# Patient Record
Sex: Female | Born: 1955 | Race: White | Hispanic: No | Marital: Married | Smoking: Former smoker
Health system: Southern US, Community
[De-identification: ages and names within clinical notes are randomized; demographics above are authoritative.]

## PROBLEM LIST (undated history)

## (undated) DIAGNOSIS — Z79899 Other long term (current) drug therapy: Secondary | ICD-10-CM

## (undated) DIAGNOSIS — I10 Essential (primary) hypertension: Secondary | ICD-10-CM

## (undated) DIAGNOSIS — M7660 Achilles tendinitis, unspecified leg: Secondary | ICD-10-CM

## (undated) DIAGNOSIS — B009 Herpesviral infection, unspecified: Secondary | ICD-10-CM

## (undated) DIAGNOSIS — N809 Endometriosis, unspecified: Secondary | ICD-10-CM

## (undated) HISTORY — DX: Achilles tendinitis, unspecified leg: M76.60

## (undated) HISTORY — DX: Herpesviral infection, unspecified: B00.9

## (undated) HISTORY — PX: BILATERAL SALPINGOOPHORECTOMY: SHX1223

## (undated) HISTORY — DX: Essential (primary) hypertension: I10

## (undated) HISTORY — DX: Endometriosis, unspecified: N80.9

## (undated) HISTORY — PX: ABDOMINAL HYSTERECTOMY: SHX81

## (undated) HISTORY — DX: Other long term (current) drug therapy: Z79.899

---

## 2006-03-22 ENCOUNTER — Encounter: Admission: RE | Admit: 2006-03-22 | Discharge: 2006-03-22 | Payer: Self-pay | Admitting: General Surgery

## 2007-05-10 ENCOUNTER — Encounter: Admission: RE | Admit: 2007-05-10 | Discharge: 2007-05-10 | Payer: Self-pay | Admitting: General Surgery

## 2008-03-14 ENCOUNTER — Other Ambulatory Visit: Admission: RE | Admit: 2008-03-14 | Discharge: 2008-03-14 | Payer: Self-pay | Admitting: Obstetrics and Gynecology

## 2008-06-03 ENCOUNTER — Encounter: Admission: RE | Admit: 2008-06-03 | Discharge: 2008-06-03 | Payer: Self-pay | Admitting: Obstetrics and Gynecology

## 2009-09-02 ENCOUNTER — Encounter: Admission: RE | Admit: 2009-09-02 | Discharge: 2009-09-02 | Payer: Self-pay | Admitting: Obstetrics and Gynecology

## 2010-09-17 ENCOUNTER — Other Ambulatory Visit: Payer: Self-pay | Admitting: Obstetrics and Gynecology

## 2010-09-17 DIAGNOSIS — Z1231 Encounter for screening mammogram for malignant neoplasm of breast: Secondary | ICD-10-CM

## 2010-10-06 ENCOUNTER — Ambulatory Visit
Admission: RE | Admit: 2010-10-06 | Discharge: 2010-10-06 | Disposition: A | Discharge status: Home / Self Care | Payer: BC Managed Care – PPO | Source: Ambulatory Visit | Attending: Obstetrics and Gynecology | Admitting: Obstetrics and Gynecology

## 2010-10-06 DIAGNOSIS — Z1231 Encounter for screening mammogram for malignant neoplasm of breast: Secondary | ICD-10-CM

## 2011-08-11 ENCOUNTER — Other Ambulatory Visit: Payer: Self-pay | Admitting: Obstetrics and Gynecology

## 2011-08-11 DIAGNOSIS — Z1231 Encounter for screening mammogram for malignant neoplasm of breast: Secondary | ICD-10-CM

## 2011-10-07 ENCOUNTER — Ambulatory Visit
Admission: RE | Admit: 2011-10-07 | Discharge: 2011-10-07 | Disposition: A | Discharge status: Home / Self Care | Payer: BC Managed Care – PPO | Source: Ambulatory Visit | Attending: Obstetrics and Gynecology | Admitting: Obstetrics and Gynecology

## 2011-10-07 DIAGNOSIS — Z1231 Encounter for screening mammogram for malignant neoplasm of breast: Secondary | ICD-10-CM

## 2012-11-06 ENCOUNTER — Other Ambulatory Visit: Payer: Self-pay

## 2012-11-06 DIAGNOSIS — Z1231 Encounter for screening mammogram for malignant neoplasm of breast: Secondary | ICD-10-CM

## 2012-11-22 ENCOUNTER — Ambulatory Visit
Admission: RE | Admit: 2012-11-22 | Discharge: 2012-11-22 | Disposition: A | Discharge status: Home / Self Care | Payer: BC Managed Care – PPO | Source: Ambulatory Visit

## 2012-11-22 DIAGNOSIS — Z1231 Encounter for screening mammogram for malignant neoplasm of breast: Secondary | ICD-10-CM

## 2013-07-11 HISTORY — PX: ACHILLES TENDON REPAIR: SUR1153

## 2013-07-31 ENCOUNTER — Other Ambulatory Visit: Payer: Self-pay | Admitting: Adult Health

## 2013-10-22 ENCOUNTER — Encounter: Payer: Self-pay | Admitting: Adult Health

## 2013-10-22 ENCOUNTER — Ambulatory Visit (INDEPENDENT_AMBULATORY_CARE_PROVIDER_SITE_OTHER): Payer: BC Managed Care – PPO | Admitting: Adult Health

## 2013-10-22 VITALS — BP 142/86 | HR 76 | Ht 64.0 in | Wt 159.0 lb

## 2013-10-22 DIAGNOSIS — Z01419 Encounter for gynecological examination (general) (routine) without abnormal findings: Secondary | ICD-10-CM

## 2013-10-22 DIAGNOSIS — Z79899 Other long term (current) drug therapy: Secondary | ICD-10-CM

## 2013-10-22 DIAGNOSIS — Z79818 Long term (current) use of other agents affecting estrogen receptors and estrogen levels: Secondary | ICD-10-CM

## 2013-10-22 DIAGNOSIS — Z1212 Encounter for screening for malignant neoplasm of rectum: Secondary | ICD-10-CM

## 2013-10-22 HISTORY — DX: Long term (current) use of other agents affecting estrogen receptors and estrogen levels: Z79.818

## 2013-10-22 HISTORY — DX: Other long term (current) drug therapy: Z79.899

## 2013-10-22 LAB — HEMOCCULT GUIAC POC 1CARD (OFFICE): FECAL OCCULT BLD: NEGATIVE

## 2013-10-22 MED ORDER — ESTRADIOL 1 MG PO TABS: ORAL_TABLET | ORAL | Status: DC

## 2013-10-22 NOTE — Patient Instructions (Signed)
Physical in 1 year Mammogram yearly  Colonoscopy advised Labs at work

## 2013-10-22 NOTE — Progress Notes (Signed)
Patient ID: Julia Harmon, female   DOB: Dec 06, 1955, 58 y.o.   MRN: [id number] History of Present Illness: [name] is a 58 year old white female, in for a physical.  Current Medications, Allergies, Past Medical History, Past Surgical History, Family History and Social History were reviewed in [facility] electronic medical record.     Review of Systems: Patient denies any headaches, blurred vision, shortness of breath, chest pain, abdominal pain, problems with bowel movements, urination, or intercourse. No mood swings, she has achilles tendonitis in left foot.    Physical Exam:BP 142/86  Pulse 76  Ht 5\' 4"  (1.626 m)  Wt 159 lb (72.122 kg)  BMI 27.28 kg/m2 General:  Well developed, well nourished, no acute distress Skin:  Warm and dry Neck:  Midline trachea, normal thyroid Lungs; Clear to auscultation bilaterally Breast:  No dominant palpable mass, retraction, or nipple discharge Cardiovascular: Regular rate and rhythm Abdomen:  Soft, non tender, no hepatosplenomegaly Pelvic:  External genitalia is normal in appearance.  The vagina is normal in appearance. The cervix and uterus are absent.  No adnexal masses or tenderness noted. Rectal: Good sphincter tone, no polyps, or hemorrhoids felt.  Hemoccult negative. Extremities:  No swelling or varicosities noted Psych:  No mood changes, alert and cooperative, seems happy Discussed, will stay on estrogen til 63 then try to wean off.  Impression: Yearly gyn exam, no pap Estrogen therapy   Plan: Physical in 1 year Mammogram yearly Colonoscopy due Refilled estrace 1 mg #30 1 daily with 11 refills Labs at work

## 2013-10-28 ENCOUNTER — Other Ambulatory Visit: Payer: Self-pay | Admitting: Adult Health

## 2014-04-09 ENCOUNTER — Other Ambulatory Visit: Payer: Self-pay

## 2014-04-09 DIAGNOSIS — Z1231 Encounter for screening mammogram for malignant neoplasm of breast: Secondary | ICD-10-CM

## 2014-04-24 ENCOUNTER — Ambulatory Visit: Payer: BC Managed Care – PPO

## 2014-04-30 ENCOUNTER — Ambulatory Visit
Admission: RE | Admit: 2014-04-30 | Discharge: 2014-04-30 | Disposition: A | Discharge status: Home / Self Care | Payer: BC Managed Care – PPO | Source: Ambulatory Visit

## 2014-04-30 DIAGNOSIS — Z1231 Encounter for screening mammogram for malignant neoplasm of breast: Secondary | ICD-10-CM

## 2014-10-29 ENCOUNTER — Other Ambulatory Visit: Payer: Self-pay | Admitting: Adult Health

## 2014-11-03 ENCOUNTER — Telehealth: Payer: Self-pay | Admitting: Adult Health

## 2014-11-03 NOTE — Telephone Encounter (Signed)
Spoke with pt. Pt is requesting a refill on Estradiol 1 mg, takes 1 daily. Pt will make an appt in the upcoming month. Thanks!! JSY

## 2014-12-03 ENCOUNTER — Other Ambulatory Visit: Payer: Self-pay | Admitting: Adult Health

## 2015-05-18 ENCOUNTER — Other Ambulatory Visit: Payer: Self-pay

## 2015-05-18 DIAGNOSIS — Z1231 Encounter for screening mammogram for malignant neoplasm of breast: Secondary | ICD-10-CM

## 2015-06-08 ENCOUNTER — Ambulatory Visit
Admission: RE | Admit: 2015-06-08 | Discharge: 2015-06-08 | Disposition: A | Discharge status: Home / Self Care | Payer: BC Managed Care – PPO | Source: Ambulatory Visit

## 2015-06-08 DIAGNOSIS — Z1231 Encounter for screening mammogram for malignant neoplasm of breast: Secondary | ICD-10-CM

## 2015-12-07 ENCOUNTER — Other Ambulatory Visit: Payer: Self-pay | Admitting: Adult Health

## 2016-01-08 ENCOUNTER — Other Ambulatory Visit: Payer: Self-pay | Admitting: Adult Health

## 2016-04-11 ENCOUNTER — Other Ambulatory Visit: Payer: Self-pay | Admitting: Adult Health

## 2016-05-16 ENCOUNTER — Other Ambulatory Visit: Payer: Self-pay | Admitting: Adult Health

## 2016-06-16 ENCOUNTER — Other Ambulatory Visit: Payer: Self-pay | Admitting: Adult Health

## 2016-07-21 ENCOUNTER — Other Ambulatory Visit: Payer: Self-pay | Admitting: Adult Health

## 2016-08-08 ENCOUNTER — Other Ambulatory Visit: Payer: Self-pay | Admitting: Obstetrics and Gynecology

## 2016-08-08 DIAGNOSIS — Z1231 Encounter for screening mammogram for malignant neoplasm of breast: Secondary | ICD-10-CM

## 2016-08-22 ENCOUNTER — Ambulatory Visit
Admission: RE | Admit: 2016-08-22 | Discharge: 2016-08-22 | Disposition: A | Discharge status: Home / Self Care | Payer: BC Managed Care – PPO | Source: Ambulatory Visit | Attending: Obstetrics and Gynecology | Admitting: Obstetrics and Gynecology

## 2016-08-22 DIAGNOSIS — Z1231 Encounter for screening mammogram for malignant neoplasm of breast: Secondary | ICD-10-CM

## 2016-08-23 ENCOUNTER — Other Ambulatory Visit: Payer: Self-pay | Admitting: Adult Health

## 2016-09-06 ENCOUNTER — Encounter: Payer: Self-pay | Admitting: Adult Health

## 2016-09-06 ENCOUNTER — Ambulatory Visit (INDEPENDENT_AMBULATORY_CARE_PROVIDER_SITE_OTHER): Payer: BC Managed Care – PPO | Admitting: Adult Health

## 2016-09-06 VITALS — BP 160/90 | HR 88 | Ht 63.0 in | Wt 162.0 lb

## 2016-09-06 DIAGNOSIS — Z01419 Encounter for gynecological examination (general) (routine) without abnormal findings: Secondary | ICD-10-CM | POA: Insufficient documentation

## 2016-09-06 DIAGNOSIS — Z79899 Other long term (current) drug therapy: Secondary | ICD-10-CM

## 2016-09-06 DIAGNOSIS — Z1211 Encounter for screening for malignant neoplasm of colon: Secondary | ICD-10-CM | POA: Diagnosis not present

## 2016-09-06 DIAGNOSIS — I10 Essential (primary) hypertension: Secondary | ICD-10-CM | POA: Insufficient documentation

## 2016-09-06 DIAGNOSIS — Z01411 Encounter for gynecological examination (general) (routine) with abnormal findings: Secondary | ICD-10-CM

## 2016-09-06 DIAGNOSIS — Z131 Encounter for screening for diabetes mellitus: Secondary | ICD-10-CM

## 2016-09-06 DIAGNOSIS — Z1212 Encounter for screening for malignant neoplasm of rectum: Secondary | ICD-10-CM

## 2016-09-06 LAB — HEMOCCULT GUIAC POC 1CARD (OFFICE): Fecal Occult Blood, POC: NEGATIVE

## 2016-09-06 MED ORDER — LOSARTAN POTASSIUM-HCTZ 50-12.5 MG PO TABS: 1.0000 | ORAL_TABLET | Freq: Every day | ORAL | 6 refills | Status: DC

## 2016-09-06 MED ORDER — ESTRADIOL 1 MG PO TABS: 1.0000 mg | ORAL_TABLET | Freq: Every day | ORAL | 12 refills | Status: DC

## 2016-09-06 NOTE — Patient Instructions (Signed)
DASH Eating Plan DASH stands for "Dietary Approaches to Stop Hypertension." The DASH eating plan is a healthy eating plan that has been shown to reduce high blood pressure (hypertension). It may also reduce your risk for type 2 diabetes, heart disease, and stroke. The DASH eating plan may also help with weight loss. What are tips for following this plan? General guidelines  Avoid eating more than 2,300 mg (milligrams) of salt (sodium) a day. If you have hypertension, you may need to reduce your sodium intake to 1,500 mg a day.  Limit alcohol intake to no more than 1 drink a day for nonpregnant women and 2 drinks a day for men. One drink equals 12 oz of beer, 5 oz of wine, or 1 oz of hard liquor.  Work with your health care provider to maintain a healthy body weight or to lose weight. Ask what an ideal weight is for you.  Get at least 30 minutes of exercise that causes your heart to beat faster (aerobic exercise) most days of the week. Activities may include walking, swimming, or biking.  Work with your health care provider or diet and nutrition specialist (dietitian) to adjust your eating plan to your individual calorie needs. Reading food labels  Check food labels for the amount of sodium per serving. Choose foods with less than 5 percent of the Daily Value of sodium. Generally, foods with less than 300 mg of sodium per serving fit into this eating plan.  To find whole grains, look for the word "whole" as the first word in the ingredient list. Shopping  Buy products labeled as "low-sodium" or "no salt added."  Buy fresh foods. Avoid canned foods and premade or frozen meals. Cooking  Avoid adding salt when cooking. Use salt-free seasonings or herbs instead of table salt or sea salt. Check with your health care provider or pharmacist before using salt substitutes.  Do not fry foods. Cook foods using healthy methods such as baking, boiling, grilling, and broiling instead.  Cook with  heart-healthy oils, such as olive, canola, soybean, or sunflower oil. Meal planning   Eat a balanced diet that includes: ? 5 or more servings of fruits and vegetables each day. At each meal, try to fill half of your plate with fruits and vegetables. ? Up to 6-8 servings of whole grains each day. ? Less than 6 oz of lean meat, poultry, or fish each day. A 3-oz serving of meat is about the same size as a deck of cards. One egg equals 1 oz. ? 2 servings of low-fat dairy each day. ? A serving of nuts, seeds, or beans 5 times each week. ? Heart-healthy fats. Healthy fats called Omega-3 fatty acids are found in foods such as flaxseeds and coldwater fish, like sardines, salmon, and mackerel.  Limit how much you eat of the following: ? Canned or prepackaged foods. ? Food that is high in trans fat, such as fried foods. ? Food that is high in saturated fat, such as fatty meat. ? Sweets, desserts, sugary drinks, and other foods with added sugar. ? Full-fat dairy products.  Do not salt foods before eating.  Try to eat at least 2 vegetarian meals each week.  Eat more home-cooked food and less restaurant, buffet, and fast food.  When eating at a restaurant, ask that your food be prepared with less salt or no salt, if possible. What foods are recommended? The items listed may not be a complete list. Talk with your dietitian about what   dietary choices are best for you. Grains Whole-grain or whole-wheat bread. Whole-grain or whole-wheat pasta. Brown rice. Oatmeal. Quinoa. Bulgur. Whole-grain and low-sodium cereals. Pita bread. Low-fat, low-sodium crackers. Whole-wheat flour tortillas. Vegetables Fresh or frozen vegetables (raw, steamed, roasted, or grilled). Low-sodium or reduced-sodium tomato and vegetable juice. Low-sodium or reduced-sodium tomato sauce and tomato paste. Low-sodium or reduced-sodium canned vegetables. Fruits All fresh, dried, or frozen fruit. Canned fruit in natural juice (without  added sugar). Meat and other protein foods Skinless chicken or turkey. Ground chicken or turkey. Pork with fat trimmed off. Fish and seafood. Egg whites. Dried beans, peas, or lentils. Unsalted nuts, nut butters, and seeds. Unsalted canned beans. Lean cuts of beef with fat trimmed off. Low-sodium, lean deli meat. Dairy Low-fat (1%) or fat-free (skim) milk. Fat-free, low-fat, or reduced-fat cheeses. Nonfat, low-sodium ricotta or cottage cheese. Low-fat or nonfat yogurt. Low-fat, low-sodium cheese. Fats and oils Soft margarine without trans fats. Vegetable oil. Low-fat, reduced-fat, or light mayonnaise and salad dressings (reduced-sodium). Canola, safflower, olive, soybean, and sunflower oils. Avocado. Seasoning and other foods Herbs. Spices. Seasoning mixes without salt. Unsalted popcorn and pretzels. Fat-free sweets. What foods are not recommended? The items listed may not be a complete list. Talk with your dietitian about what dietary choices are best for you. Grains Baked goods made with fat, such as croissants, muffins, or some breads. Dry pasta or rice meal packs. Vegetables Creamed or fried vegetables. Vegetables in a cheese sauce. Regular canned vegetables (not low-sodium or reduced-sodium). Regular canned tomato sauce and paste (not low-sodium or reduced-sodium). Regular tomato and vegetable juice (not low-sodium or reduced-sodium). Pickles. Olives. Fruits Canned fruit in a light or heavy syrup. Fried fruit. Fruit in cream or butter sauce. Meat and other protein foods Fatty cuts of meat. Ribs. Fried meat. Bacon. Sausage. Bologna and other processed lunch meats. Salami. Fatback. Hotdogs. Bratwurst. Salted nuts and seeds. Canned beans with added salt. Canned or smoked fish. Whole eggs or egg yolks. Chicken or turkey with skin. Dairy Whole or 2% milk, cream, and half-and-half. Whole or full-fat cream cheese. Whole-fat or sweetened yogurt. Full-fat cheese. Nondairy creamers. Whipped toppings.  Processed cheese and cheese spreads. Fats and oils Butter. Stick margarine. Lard. Shortening. Ghee. Bacon fat. Tropical oils, such as coconut, palm kernel, or palm oil. Seasoning and other foods Salted popcorn and pretzels. Onion salt, garlic salt, seasoned salt, table salt, and sea salt. Worcestershire sauce. Tartar sauce. Barbecue sauce. Teriyaki sauce. Soy sauce, including reduced-sodium. Steak sauce. Canned and packaged gravies. Fish sauce. Oyster sauce. Cocktail sauce. Horseradish that you find on the shelf. Ketchup. Mustard. Meat flavorings and tenderizers. Bouillon cubes. Hot sauce and Tabasco sauce. Premade or packaged marinades. Premade or packaged taco seasonings. Relishes. Regular salad dressings. Where to find more information:  National Heart, Lung, and Blood Institute: www.nhlbi.nih.gov  American Heart Association: www.heart.org Summary  The DASH eating plan is a healthy eating plan that has been shown to reduce high blood pressure (hypertension). It may also reduce your risk for type 2 diabetes, heart disease, and stroke.  With the DASH eating plan, you should limit salt (sodium) intake to 2,300 mg a day. If you have hypertension, you may need to reduce your sodium intake to 1,500 mg a day.  When on the DASH eating plan, aim to eat more fresh fruits and vegetables, whole grains, lean proteins, low-fat dairy, and heart-healthy fats.  Work with your health care provider or diet and nutrition specialist (dietitian) to adjust your eating plan to your individual   calorie needs. This information is not intended to replace advice given to you by your health care provider. Make sure you discuss any questions you have with your health care provider. Document Released: 06/16/2011 Document Revised: 06/20/2016 Document Reviewed: 06/20/2016 Elsevier Interactive Patient Education  2017 Elsevier Inc.  

## 2016-09-06 NOTE — Progress Notes (Signed)
Patient ID: Julia Harmon, female   DOB: 1956/05/22, 61 y.o.   MRN: [id number] History of Present Illness: Julia Harmon is a 61 year old white female, married, G0P0, sp hysterectomy on ET in for well woman gyn exam. PCP is Dr Margo Commonapper.   Current Medications, Allergies, Past Medical History, Past Surgical History, Family History and Social History were reviewed in [facility] electronic medical record.     Review of Systems: Patient denies any headaches, hearing loss, fatigue, blurred vision, shortness of breath, chest pain, abdominal pain, problems with bowel movements, urination, or intercourse. No joint pain or mood swings.    Physical Exam:BP (!) 160/90 (BP Location: Right Arm, Patient Position: Sitting, Cuff Size: Normal)   Pulse 88   Ht 5\' 3"  (1.6 m)   Wt 162 lb (73.5 kg)   BMI 28.70 kg/m  General:  Well developed, well nourished, no acute distress Skin:  Warm and dry Neck:  Midline trachea, normal thyroid, good ROM, no lymphadenopathy,nop carotid bruits heard Lungs; Clear to auscultation bilaterally Breast:  No dominant palpable mass, retraction, or nipple discharge Cardiovascular: Regular rate and rhythm Abdomen:  Soft, non tender, no hepatosplenomegaly Pelvic:  External genitalia is normal in appearance, no lesions.  The vagina is normal in appearance. Urethra has no lesions or masses. The cervix and uterus are absent.  No adnexal masses or tenderness noted.Bladder is non tender, no masses felt. Rectal: Good sphincter tone, no polyps, + hemorrhoid felt.  Hemoccult negative. Extremities/musculoskeletal:  No swelling or varicosities noted, no clubbing or cyanosis,has lipoma  back right thigh Psych:  No mood changes, alert and cooperative,seems happy PHQ 2 score 0.She is aware of risks and benefits of ET and wants to continue.  Will another BP med and recheck BP in 4 weeks.  Impression: 1. Well woman exam with routine gynecological exam   2. Essential hypertension   3. Current use  of estrogen therapy   4. Screening for colorectal cancer   5. Screening for diabetes mellitus       Plan: Rx estrace 1 mg #30 take 1 daily with 12 refills Rx losartan-hydrochlorothiazide 50-12.5 mg #30 take 1 daily with 6 refills Follow up in 4 weeks for BP check Check CBC,CMP,TSH and lipids,A1c and vitamin D Physical in 1 year Mammogram yearly Colonoscopy per Dr Karilyn Cotaehman [facility]

## 2016-10-04 ENCOUNTER — Telehealth: Payer: Self-pay | Admitting: Adult Health

## 2016-10-04 ENCOUNTER — Encounter: Payer: Self-pay | Admitting: Adult Health

## 2016-10-04 ENCOUNTER — Ambulatory Visit (INDEPENDENT_AMBULATORY_CARE_PROVIDER_SITE_OTHER): Payer: BC Managed Care – PPO | Admitting: Adult Health

## 2016-10-04 VITALS — BP 120/72 | HR 78 | Ht 64.0 in | Wt 159.0 lb

## 2016-10-04 DIAGNOSIS — I10 Essential (primary) hypertension: Secondary | ICD-10-CM | POA: Diagnosis not present

## 2016-10-04 NOTE — Progress Notes (Signed)
Subjective:     Patient ID: Julia Harmon, female   DOB: [birth year], 61 y.o.   MRN: [id number]  HPI [name] is a 61 year old white female back in for BP check, she was started in losartan-hydrochlorothiazide 50-12.5 mg 1 daily and advised to follow DASH diet.She feels good, no complaints.  Review of Systems  Patient denies any headaches, hearing loss, fatigue, blurred vision, shortness of breath, chest pain, abdominal pain, problems with bowel movements, urination, or intercourse. No joint pain or mood swings. Reviewed past medical,surgical, social and family history. Reviewed medications and allergies.     Objective:   Physical Exam BP 120/72 (BP Location: Left Arm, Patient Position: Sitting, Cuff Size: Normal)   Pulse 78   Ht 5\' 4"  (1.626 m)   Wt 159 lb (72.1 kg)   BMI 27.29 kg/m  Skin warm and dry. Neck: mid line trachea, normal thyroid, good ROM, no lymphadenopathy noted. Lungs: clear to ausculation bilaterally. Cardiovascular: regular rate and rhythm. She has lost 3 lbs and BP is great, continue meds and follow up in 3 months    Assessment:     1. Essential hypertension       Plan:     Continue meds Follow up in 3 months for BP check and ROS

## 2016-10-04 NOTE — Telephone Encounter (Signed)
Reminded her to get labs done, and she says  she will before next appt.

## 2016-10-28 ENCOUNTER — Other Ambulatory Visit: Payer: Self-pay | Admitting: Adult Health

## 2017-01-04 ENCOUNTER — Ambulatory Visit: Payer: BC Managed Care – PPO | Admitting: Adult Health

## 2017-01-17 ENCOUNTER — Encounter: Payer: Self-pay | Admitting: Adult Health

## 2017-01-17 ENCOUNTER — Ambulatory Visit (INDEPENDENT_AMBULATORY_CARE_PROVIDER_SITE_OTHER): Payer: BC Managed Care – PPO | Admitting: Adult Health

## 2017-01-17 VITALS — BP 114/68 | HR 86 | Ht 64.0 in | Wt 161.0 lb

## 2017-01-17 DIAGNOSIS — I10 Essential (primary) hypertension: Secondary | ICD-10-CM

## 2017-01-17 LAB — CBC
Hematocrit: 41.5 % (ref 34.0–46.6)
Hemoglobin: 14 g/dL (ref 11.1–15.9)
MCH: 30.8 pg (ref 26.6–33.0)
MCHC: 33.7 g/dL (ref 31.5–35.7)
MCV: 91 fL (ref 79–97)
PLATELETS: 300 10*3/uL (ref 150–379)
RBC: 4.55 x10E6/uL (ref 3.77–5.28)
RDW: 14.2 % (ref 12.3–15.4)
WBC: 7 10*3/uL (ref 3.4–10.8)

## 2017-01-17 LAB — COMPREHENSIVE METABOLIC PANEL
A/G RATIO: 1.7 (ref 1.2–2.2)
ALT: 19 IU/L (ref 0–32)
AST: 20 IU/L (ref 0–40)
Albumin: 4.1 g/dL (ref 3.6–4.8)
Alkaline Phosphatase: 96 IU/L (ref 39–117)
BUN/Creatinine Ratio: 27 (ref 12–28)
BUN: 17 mg/dL (ref 8–27)
Bilirubin Total: 0.4 mg/dL (ref 0.0–1.2)
CALCIUM: 9.2 mg/dL (ref 8.7–10.3)
CHLORIDE: 102 mmol/L (ref 96–106)
CO2: 24 mmol/L (ref 20–29)
Creatinine, Ser: 0.62 mg/dL (ref 0.57–1.00)
GFR, EST AFRICAN AMERICAN: 112 mL/min/{1.73_m2} (ref >59)
GFR, EST NON AFRICAN AMERICAN: 97 mL/min/{1.73_m2} (ref >59)
GLUCOSE: 97 mg/dL (ref 65–99)
Globulin, Total: 2.4 g/dL (ref 1.5–4.5)
POTASSIUM: 4.2 mmol/L (ref 3.5–5.2)
Sodium: 140 mmol/L (ref 134–144)
TOTAL PROTEIN: 6.5 g/dL (ref 6.0–8.5)

## 2017-01-17 LAB — LIPID PANEL
CHOL/HDL RATIO: 2.7 ratio (ref 0.0–4.4)
Cholesterol, Total: 187 mg/dL (ref 100–199)
HDL: 70 mg/dL (ref >39)
LDL CALC: 98 mg/dL (ref 0–99)
TRIGLYCERIDES: 96 mg/dL (ref 0–149)
VLDL Cholesterol Cal: 19 mg/dL (ref 5–40)

## 2017-01-17 LAB — TSH: TSH: 2.18 u[IU]/mL (ref 0.450–4.500)

## 2017-01-17 LAB — VITAMIN D 25 HYDROXY (VIT D DEFICIENCY, FRACTURES): VIT D 25 HYDROXY: 30.5 ng/mL (ref 30.0–100.0)

## 2017-01-17 LAB — HEMOGLOBIN A1C
Est. average glucose Bld gHb Est-mCnc: 117 mg/dL
Hgb A1c MFr Bld: 5.7 % — ABNORMAL HIGH (ref 4.8–5.6)

## 2017-01-17 NOTE — Progress Notes (Signed)
Subjective:     Patient ID: Julia Harmon, female   DOB: [birth year], 61 y.o.   MRN: [id number]  HPI Julia Harmon is a 61 year old white female in for BP Check and to review recent labs.No complaints.   Review of Systems  Patient denies any headaches, hearing loss, fatigue, blurred vision, shortness of breath, chest pain, abdominal pain, problems with bowel movements, urination, or intercourse. No joint pain or mood swings. Reviewed past medical,surgical, social and family history. Reviewed medications and allergies.     Objective:   Physical Exam BP 114/68 (BP Location: Right Arm, Patient Position: Sitting, Cuff Size: Normal)   Pulse 86   Ht 5\' 4"  (1.626 m)   Wt 161 lb (73 kg)   BMI 27.64 kg/m  Skin warm and dry.Lungs: clear to ausculation bilaterally. Cardiovascular: regular rate and rhythm.   Reviewed labs with pt, only abnormal value was A1c was 5.7. Discussed cutting back on carbs.Praised over BP.  Assessment:     Hypertension     Plan:     Continue meds Decrease carbs,follow DASH diet  Follow up in 3 months

## 2017-04-05 ENCOUNTER — Telehealth: Payer: Self-pay | Admitting: *Deleted

## 2017-04-05 MED ORDER — LOSARTAN POTASSIUM-HCTZ 50-12.5 MG PO TABS: 1.0000 | ORAL_TABLET | Freq: Every day | ORAL | 6 refills | Status: DC

## 2017-04-05 NOTE — Telephone Encounter (Signed)
Left message that refill sent

## 2017-04-19 ENCOUNTER — Ambulatory Visit: Payer: BC Managed Care – PPO | Admitting: Adult Health

## 2017-04-25 ENCOUNTER — Encounter: Payer: Self-pay | Admitting: Adult Health

## 2017-04-25 ENCOUNTER — Encounter (INDEPENDENT_AMBULATORY_CARE_PROVIDER_SITE_OTHER): Payer: Self-pay

## 2017-04-25 ENCOUNTER — Ambulatory Visit (INDEPENDENT_AMBULATORY_CARE_PROVIDER_SITE_OTHER): Payer: BC Managed Care – PPO | Admitting: Adult Health

## 2017-04-25 VITALS — BP 108/72 | HR 94 | Resp 18 | Ht 64.0 in | Wt 161.0 lb

## 2017-04-25 DIAGNOSIS — I10 Essential (primary) hypertension: Secondary | ICD-10-CM

## 2017-04-25 NOTE — Progress Notes (Signed)
Subjective:     Patient ID: Julia Harmon, female   DOB: [birth year], 61 y.o.   MRN: [id number]  HPI Julia Harmon is a 61 year old white female, in for BP.No complaints.   Review of Systems  Occasional headache, no dizziness Trouble going to sleep  Reviewed past medical,surgical, social and family history. Reviewed medications and allergies.     Objective:   Physical Exam BP 108/72 (BP Location: Left Arm, Patient Position: Sitting, Cuff Size: Normal)   Pulse 94   Resp 18   Ht  (1.626 m)   Wt 161 lb (73 kg)   SpO2 100%   BMI 27.64 kg/m  Skin warm and dry.Lungs: clear to ausculation bilaterally. Cardiovascular: regular rate and rhythm.Praised over BP.    Assessment:     1. Essential hypertension       Plan:    OK to try melatonin 10 mg quick release  Continue hyzaar, and estrace  F/U in February for physical

## 2017-08-25 ENCOUNTER — Other Ambulatory Visit: Payer: BC Managed Care – PPO | Admitting: Adult Health

## 2017-09-22 ENCOUNTER — Other Ambulatory Visit: Payer: Self-pay | Admitting: Adult Health

## 2017-10-16 ENCOUNTER — Other Ambulatory Visit: Payer: Self-pay | Admitting: Adult Health

## 2017-10-16 ENCOUNTER — Ambulatory Visit: Payer: BC Managed Care – PPO | Admitting: Adult Health

## 2017-10-16 ENCOUNTER — Encounter: Payer: Self-pay | Admitting: Adult Health

## 2017-10-16 VITALS — BP 130/82 | HR 95 | Ht 64.0 in | Wt 160.0 lb

## 2017-10-16 DIAGNOSIS — Z01419 Encounter for gynecological examination (general) (routine) without abnormal findings: Secondary | ICD-10-CM

## 2017-10-16 DIAGNOSIS — I10 Essential (primary) hypertension: Secondary | ICD-10-CM

## 2017-10-16 DIAGNOSIS — Z01411 Encounter for gynecological examination (general) (routine) with abnormal findings: Secondary | ICD-10-CM

## 2017-10-16 DIAGNOSIS — Z79899 Other long term (current) drug therapy: Secondary | ICD-10-CM

## 2017-10-16 DIAGNOSIS — Z79818 Long term (current) use of other agents affecting estrogen receptors and estrogen levels: Secondary | ICD-10-CM

## 2017-10-16 DIAGNOSIS — Z1211 Encounter for screening for malignant neoplasm of colon: Secondary | ICD-10-CM

## 2017-10-16 DIAGNOSIS — Z1212 Encounter for screening for malignant neoplasm of rectum: Secondary | ICD-10-CM | POA: Diagnosis not present

## 2017-10-16 LAB — HEMOCCULT GUIAC POC 1CARD (OFFICE): FECAL OCCULT BLD: NEGATIVE

## 2017-10-16 NOTE — Progress Notes (Signed)
Patient ID: Julia Harmon, female   DOB: 11/23/1955, 62 y.o.   MRN: [id number] History of Present Illness: Julia Harmon is a 62 year old white female, married, sp hysterectomy in for well woman gyn exam. [place] [facility], but has not seen yet, she transferred there when Dr Margo Commonapper retired.    Current Medications, Allergies, Past Medical History, Past Surgical History, Family History and Social History were reviewed in [facility] electronic medical record.     Review of Systems: Patient denies any headaches, hearing loss, fatigue, blurred vision, shortness of breath, chest pain, abdominal pain, problems with bowel movements, urination, or intercourse. No joint pain or mood swings. She is happy with ET.    Physical Exam:BP 130/82 (BP Location: Left Arm, Patient Position: Sitting, Cuff Size: Normal)   Pulse 95   Ht 5\' 4"  (1.626 m)   Wt 160 lb (72.6 kg)   BMI 27.46 kg/m  General:  Well developed, well nourished, no acute distress Skin:  Warm and dry Neck:  Midline trachea, normal thyroid, good ROM, no lymphadenopathy,no carotid bruits heard. Lungs; Clear to auscultation bilaterally Breast:  No dominant palpable mass, retraction, or nipple discharge Cardiovascular: Regular rate and rhythm Abdomen:  Soft, non tender, no hepatosplenomegaly Pelvic:  External genitalia is normal in appearance, no lesions.  The vagina is normal in appearance. Urethra has no lesions or masses. The cervix and uterus are absent.  No adnexal masses or tenderness noted.Bladder is non tender, no masses felt.Has lipoma  right thigh. Rectal: Good sphincter tone, no polyps, or hemorrhoids felt.  Hemoccult negative. Extremities/musculoskeletal:  No swelling or varicosities noted, no clubbing or cyanosis Psych:  No mood changes, alert and cooperative,seems happy PHQ 2 score 0.She is on BP meds with losartan in it,and  was concerned on recall, to check with pharmacist, about lot numbers.But I don't think  the combination meds were affected.    Impression: 1. Well woman exam with routine gynecological exam   2. Current use of estrogen therapy   3. Essential hypertension   4. Screening for colorectal cancer     Plan: Continue current meds, has refills Physical in 1 year Labs next year, had last year and they were normal Check insurance on colo guard, she had colonoscopy in about 2000 and was [facility],if insurance covers will order colo guard

## 2017-11-20 ENCOUNTER — Other Ambulatory Visit: Payer: Self-pay | Admitting: Obstetrics and Gynecology

## 2017-11-20 DIAGNOSIS — Z1231 Encounter for screening mammogram for malignant neoplasm of breast: Secondary | ICD-10-CM

## 2017-12-11 ENCOUNTER — Ambulatory Visit
Admission: RE | Admit: 2017-12-11 | Discharge: 2017-12-11 | Disposition: A | Discharge status: Home / Self Care | Payer: BC Managed Care – PPO | Source: Ambulatory Visit | Attending: Obstetrics and Gynecology | Admitting: Obstetrics and Gynecology

## 2017-12-11 DIAGNOSIS — Z1231 Encounter for screening mammogram for malignant neoplasm of breast: Secondary | ICD-10-CM

## 2017-12-18 ENCOUNTER — Telehealth: Payer: Self-pay | Admitting: *Deleted

## 2017-12-18 NOTE — Telephone Encounter (Signed)
LMOVM that mammogram was normal.  

## 2018-01-22 ENCOUNTER — Telehealth: Payer: Self-pay | Admitting: *Deleted

## 2018-01-22 MED ORDER — FLUTICASONE PROPIONATE 50 MCG/ACT NA SUSP: 1.0000 | NASAL | 3 refills | Status: DC | PRN

## 2018-01-22 NOTE — Addendum Note (Signed)
Addended by: Cyril MourningGRIFFIN, JENNIFER A on: 01/22/2018 11:38 AM   Modules accepted: Orders

## 2018-01-22 NOTE — Telephone Encounter (Signed)
Left message that [facility]

## 2018-01-22 NOTE — Telephone Encounter (Signed)
Patient requesting refill on Flonase.  States prescriber who generally gives it to her has retired.  Please advise.

## 2018-05-13 ENCOUNTER — Other Ambulatory Visit: Payer: Self-pay | Admitting: Adult Health

## 2018-10-08 ENCOUNTER — Other Ambulatory Visit: Payer: Self-pay | Admitting: Adult Health

## 2018-12-06 ENCOUNTER — Other Ambulatory Visit: Payer: Self-pay | Admitting: Adult Health

## 2019-04-25 ENCOUNTER — Telehealth: Payer: Self-pay | Admitting: *Deleted

## 2019-04-25 NOTE — Telephone Encounter (Signed)
Pt left message that her estradiol is no long the preferred med by her insurance. Would like a call back to discuss.

## 2019-04-25 NOTE — Telephone Encounter (Signed)
Pt says estradiol works good for her and now medicare does not want to cover it.. Ask Laurey Arrow the cash price

## 2019-04-25 NOTE — Telephone Encounter (Addendum)
Pt states Estradiol is working good for her but now it is no longer the preferred med. What do you advise? Pt states her paper she received didn't say what the preferred drug was. Pt's cell # is [phone]. [place]

## 2019-04-26 ENCOUNTER — Other Ambulatory Visit: Payer: Self-pay

## 2019-04-29 ENCOUNTER — Ambulatory Visit: Payer: Medicare Other | Admitting: Family Medicine

## 2019-04-29 ENCOUNTER — Telehealth: Payer: Self-pay | Admitting: Family Medicine

## 2019-04-29 ENCOUNTER — Other Ambulatory Visit: Payer: Self-pay

## 2019-04-29 ENCOUNTER — Encounter: Payer: Self-pay | Admitting: Family Medicine

## 2019-04-29 VITALS — BP 117/79 | HR 83 | Temp 98.6°F | Ht 64.0 in | Wt 161.0 lb

## 2019-04-29 DIAGNOSIS — Z1159 Encounter for screening for other viral diseases: Secondary | ICD-10-CM

## 2019-04-29 DIAGNOSIS — Z780 Asymptomatic menopausal state: Secondary | ICD-10-CM

## 2019-04-29 DIAGNOSIS — Z23 Encounter for immunization: Secondary | ICD-10-CM

## 2019-04-29 DIAGNOSIS — M4722 Other spondylosis with radiculopathy, cervical region: Secondary | ICD-10-CM

## 2019-04-29 DIAGNOSIS — Z79899 Other long term (current) drug therapy: Secondary | ICD-10-CM

## 2019-04-29 DIAGNOSIS — Z0001 Encounter for general adult medical examination with abnormal findings: Secondary | ICD-10-CM

## 2019-04-29 DIAGNOSIS — Z1211 Encounter for screening for malignant neoplasm of colon: Secondary | ICD-10-CM

## 2019-04-29 DIAGNOSIS — Z0000 Encounter for general adult medical examination without abnormal findings: Secondary | ICD-10-CM

## 2019-04-29 DIAGNOSIS — I10 Essential (primary) hypertension: Secondary | ICD-10-CM

## 2019-04-29 DIAGNOSIS — J302 Other seasonal allergic rhinitis: Secondary | ICD-10-CM | POA: Diagnosis not present

## 2019-04-29 MED ORDER — ESTRADIOL 1 MG PO TABS: 1.0000 mg | ORAL_TABLET | Freq: Every day | ORAL | 1 refills | Status: DC

## 2019-04-29 NOTE — Progress Notes (Signed)
New Patient Office Visit  Assessment & Plan:  1. Well adult exam - Preventive care education provided. Patient declined TDAP and HIV screening.  - Pneumococcal conjugate vaccine 13-valent - CBC with Differential/Platelet - CMP14+EGFR - Lipid panel  2. Essential hypertension - Well controlled on current regimen.  - CMP14+EGFR - Lipid panel  3. Seasonal allergies - Well controlled on current regimen.   4. Current use of estrogen therapy - Patient aware of risks associated with use of estrogen. She has been notified by her insurance they will no longer cover the medication. She is going to use a GoodRx discount to pay for it.  - estradiol (ESTRACE) 1 MG tablet; Take 1 tablet (1 mg total) by mouth daily.  Dispense: 90 tablet; Refill: 1  5. Osteoarthritis of spine with radiculopathy, cervical region - Education provided on cervical radiculopathy. Ibuprofen 400-600 mg with Tylenol 500 mg every 6-8 hours for pain. Continue ice and TENS unit. Suggested use of muscle rub PRN. Patient to call back if this is not helpful and I will send steroids.   6. Encounter for hepatitis C screening test for low risk patient - Hepatitis C antibody  7. Colon cancer screening - Cologuard  8. Postmenopausal estrogen deficiency - DG [facility] DEXA  9. Need for immunization against influenza - Flu Vaccine QUAD High Dose(Fluad)   Follow-up: Return in about 1 year (around 04/28/2020) for [facility].   Hendricks Limes, MSN, APRN, FNP-C [facility]  Subjective:  Patient ID: Julia Harmon, female    DOB: Mar 08, 1956  Age: 63 y.o. MRN: [id number]  Patient Care Team: Loman Brooklyn, FNP as PCP - General (Family Medicine) Estill Dooms, NP as Nurse Practitioner (Obstetrics and Gynecology)  CC:  Chief Complaint  Patient presents with  . New Patient (Initial Visit)    HPI Julia Harmon presents to establish care. Patient is transferring from Dr. Scotty Court at [place] at  [facility]; she has not been seen in a few years as her OBGYN has been taking care of everything for her.   Patient also has concerns regarding her neck. She reports neck pain that she first noticed ~2 years ago. She went to a chiropractor who took x-rays and told her she has degenerative disc disease in her cervical spine. She does experience pain that radiates down the left arm when it flares up. Each time she has had a bad flare the chiropractor is able to work it out over several visits and recommended she take Tylenol and Ibuprofen together. She also applies ice and uses a TENS unit when it starts acting up. No aggravating factors that she is aware of.    Review of Systems  Constitutional: Negative for chills, fever, malaise/fatigue and weight loss.  HENT: Negative for congestion, ear discharge, ear pain, nosebleeds, sinus pain, sore throat and tinnitus.   Eyes: Negative for blurred vision, double vision, pain, discharge and redness.  Respiratory: Negative for cough, shortness of breath and wheezing.   Cardiovascular: Negative for chest pain, palpitations and leg swelling.  Gastrointestinal: Negative for abdominal pain, constipation, diarrhea, heartburn, nausea and vomiting.  Genitourinary: Negative for dysuria, frequency and urgency.  Musculoskeletal: Positive for neck pain. Negative for myalgias.  Skin: Negative for rash.  Neurological: Negative for dizziness, seizures, weakness and headaches.  Psychiatric/Behavioral: Negative for depression, substance abuse and suicidal ideas. The patient is not nervous/anxious.     Current Outpatient Medications:  .  estradiol (ESTRACE) 1 MG tablet, Take 1  tablet (1 mg total) by mouth daily., Disp: 90 tablet, Rfl: 1 .  loratadine (CLARITIN) 10 MG tablet, Take 10 mg by mouth daily., Disp: , Rfl:  .  losartan-hydrochlorothiazide (HYZAAR) 50-12.5 MG tablet, TAKE 1 TABLET BY MOUTH DAILY, Disp: 30 tablet, Rfl: 6  No Known Allergies  Past Medical History:   Diagnosis Date  . Achilles tendinitis   . Current use of estrogen therapy 10/22/2013  . Endometriosis   . Hypertension     Past Surgical History:  Procedure Laterality Date  . ABDOMINAL HYSTERECTOMY    . ACHILLES TENDON REPAIR Left 2015  . BILATERAL SALPINGOOPHORECTOMY      Family History  Problem Relation Age of Onset  . Heart attack Mother   . Lung cancer Father   . Emphysema Brother   . Bladder Cancer Maternal Aunt   . Stroke Maternal Grandmother   . Heart attack Paternal Grandmother     Social History   Socioeconomic History  . Marital status: Single    Spouse name: Not on file  . Number of children: Not on file  . Years of education: Not on file  . Highest education level: Not on file  Occupational History  . Not on file  Social Needs  . Financial resource strain: Not on file  . Food insecurity    Worry: Not on file    Inability: Not on file  . Transportation needs    Medical: Not on file    Non-medical: Not on file  Tobacco Use  . Smoking status: Former Smoker    Years: 10.00    Types: Cigarettes  . Smokeless tobacco: Never Used  Substance and Sexual Activity  . Alcohol use: Yes    Alcohol/week: 2.0 standard drinks    Types: 2 Glasses of wine per week    Comment: occ  . Drug use: No  . Sexual activity: Yes    Birth control/protection: Surgical    Comment: hyst  Lifestyle  . Physical activity    Days per week: Not on file    Minutes per session: Not on file  . Stress: Not on file  Relationships  . Social Herbalist on phone: Not on file    Gets together: Not on file    Attends religious service: Not on file    Active member of club or organization: Not on file    Attends meetings of clubs or organizations: Not on file    Relationship status: Not on file  . Intimate partner violence    Fear of current or ex partner: Not on file    Emotionally abused: Not on file    Physically abused: Not on file    Forced sexual activity: Not on  file  Other Topics Concern  . Not on file  Social History Narrative  . Not on file    Objective:   Today's Vitals: BP 117/79   Pulse 83   Temp 98.6 F (37 C) (Temporal)   Ht '5\' 4"'$  (1.626 m)   Wt 161 lb (73 kg)   SpO2 98%   BMI 27.64 kg/m   Physical Exam Vitals signs reviewed.  Constitutional:      General: She is not in acute distress.    Appearance: Normal appearance. She is overweight. She is not ill-appearing, toxic-appearing or diaphoretic.  HENT:     Head: Normocephalic and atraumatic.     Right Ear: Tympanic membrane, ear canal and external ear normal. There is no  impacted cerumen.     Left Ear: Tympanic membrane, ear canal and external ear normal. There is no impacted cerumen.     Ears:     Comments: Excessive cerumen of right ear.    Nose: Nose normal. No congestion or rhinorrhea.     Mouth/Throat:     Mouth: Mucous membranes are moist.     Pharynx: Oropharynx is clear. No oropharyngeal exudate or posterior oropharyngeal erythema.  Eyes:     General: No scleral icterus.       Right eye: No discharge.        Left eye: No discharge.     Conjunctiva/sclera: Conjunctivae normal.     Pupils: Pupils are equal, round, and reactive to light.  Neck:     Musculoskeletal: Normal range of motion and neck supple. No neck rigidity or muscular tenderness.  Cardiovascular:     Rate and Rhythm: Normal rate and regular rhythm.     Heart sounds: Normal heart sounds. No murmur. No friction rub. No gallop.   Pulmonary:     Effort: Pulmonary effort is normal. No respiratory distress.     Breath sounds: Normal breath sounds. No stridor. No wheezing, rhonchi or rales.  Abdominal:     General: Abdomen is flat. Bowel sounds are normal. There is no distension.     Palpations: Abdomen is soft. There is no mass.     Tenderness: There is no abdominal tenderness. There is no guarding or rebound.     Hernia: No hernia is present.  Musculoskeletal: Normal range of motion.   Lymphadenopathy:     Cervical: No cervical adenopathy.  Skin:    General: Skin is warm and dry.     Capillary Refill: Capillary refill takes less than 2 seconds.  Neurological:     General: No focal deficit present.     Mental Status: She is alert and oriented to person, place, and time. Mental status is at baseline.  Psychiatric:        Mood and Affect: Mood normal.        Behavior: Behavior normal.        Thought Content: Thought content normal.        Judgment: Judgment normal.

## 2019-04-29 NOTE — Patient Instructions (Signed)
Cervical Radiculopathy  Cervical radiculopathy means that a nerve in the neck (a cervical nerve) is pinched or bruised. This can happen because of an injury to the cervical spine (vertebrae) in the neck, or as a normal part of getting older. This can cause pain or loss of feeling (numbness) that runs from your neck all the way down to your arm and fingers. Often, this condition gets better with rest. Treatment may be needed if the condition does not get better. What are the causes?  A neck injury.  A bulging disk in your spine.  Muscle movements that you cannot control (muscle spasms).  Tight muscles in your neck due to overuse.  Arthritis.  Breakdown in the bones and joints of the spine (spondylosis) due to getting older.  Bone spurs that form near the nerves in the neck. What are the signs or symptoms?  Pain. The pain may: ? Run from the neck to the arm and hand. ? Be very bad or irritating. ? Be worse when you move your neck.  Loss of feeling or tingling in your arm or hand.  Weakness in your arm or hand, in very bad cases. How is this treated? In many cases, treatment is not needed for this condition. With rest, the condition often gets better over time. If treatment is needed, options may include:  Wearing a soft neck collar (cervical collar) for short periods of time, as told by your doctor.  Doing exercises (physical therapy) to strengthen your neck muscles.  Taking medicines.  Having shots (injections) in your spine, in very bad cases.  Having surgery. This may be needed if other treatments do not help. The type of surgery that is used depends on the cause of your condition. Follow these instructions at home: If you have a soft neck collar:  Wear it as told by your doctor. Remove it only as told by your doctor.  Ask your doctor if you can remove the collar for cleaning and bathing. If you are allowed to remove the collar for cleaning or bathing: ? Follow  instructions from your doctor about how to remove the collar safely. ? Clean the collar by wiping it with mild soap and water and drying it completely. ? Take out any removable pads in the collar every 1-2 days. Wash them by hand with soap and water. Let them air-dry completely before you put them back in the collar. ? Check your skin under the collar for redness or sores. If you see any, tell your doctor. Managing pain      Take over-the-counter and prescription medicines only as told by your doctor.  If told, put ice on the painful area. ? If you have a soft neck collar, remove it as told by your doctor. ? Put ice in a plastic bag. ? Place a towel between your skin and the bag. ? Leave the ice on for 20 minutes, 2-3 times a day.  If using ice does not help, you can try using heat. Use the heat source that your doctor recommends, such as a moist heat pack or a heating pad. ? Place a towel between your skin and the heat source. ? Leave the heat on for 20-30 minutes. ? Remove the heat if your skin turns bright red. This is very important if you are unable to feel pain, heat, or cold. You may have a greater risk of getting burned.  You may try a gentle neck and shoulder rub (massage). Activity  Rest as needed.  Return to your normal activities as told by your doctor. Ask your doctor what activities are safe for you.  Do exercises as told by your doctor or physical therapist.  Do not lift anything that is heavier than 10 lb (4.5 kg) until your doctor tells you that it is safe. General instructions  Use a flat pillow when you sleep.  Do not drive while wearing a soft neck collar. If you do not have a soft neck collar, ask your doctor if it is safe to drive while your neck heals.  Ask your doctor if the medicine prescribed to you requires you to avoid driving or using heavy machinery.  Do not use any products that contain nicotine or tobacco, such as cigarettes, e-cigarettes, and  chewing tobacco. These can delay healing. If you need help quitting, ask your doctor.  Keep all follow-up visits as told by your doctor. This is important. Contact a doctor if:  Your condition does not get better with treatment. Get help right away if:  Your pain gets worse and is not helped with medicine.  You lose feeling or feel weak in your hand, arm, face, or leg.  You have a high fever.  You have a stiff neck.  You cannot control when you poop or pee (have incontinence).  You have trouble with walking, balance, or talking. Summary  Cervical radiculopathy means that a nerve in the neck is pinched or bruised.  A nerve can get pinched from a bulging disk, arthritis, an injury to the neck, or other causes.  Symptoms include pain, tingling, or loss of feeling that goes from the neck into the arm or hand.  Weakness in your arm or hand can happen in very bad cases.  Treatment may include resting, wearing a soft neck collar, and doing exercises. You might need to take medicines for pain. In very bad cases, shots or surgery may be needed. This information is not intended to replace advice given to you by your health care provider. Make sure you discuss any questions you have with your health care provider. Document Released: 06/16/2011 Document Revised: 05/18/2018 Document Reviewed: 05/18/2018 Elsevier Patient Education  2020 [employer] 65 Years and Older, Female Preventive care refers to lifestyle choices and visits with your health care provider that can promote health and wellness. This includes:  A yearly physical exam. This is also called an annual well check.  Regular dental and eye exams.  Immunizations.  Screening for certain conditions.  Healthy lifestyle choices, such as diet and exercise. What can I expect for my preventive care visit? Physical exam Your health care provider will check:  Height and weight. These may be used to calculate  body mass index (BMI), which is a measurement that tells if you are at a healthy weight.  Heart rate and blood pressure.  Your skin for abnormal spots. Counseling Your health care provider may ask you questions about:  Alcohol, tobacco, and drug use.  Emotional well-being.  Home and relationship well-being.  Sexual activity.  Eating habits.  History of falls.  Memory and ability to understand (cognition).  Work and work Statistician.  Pregnancy and menstrual history. What immunizations do I need?  Influenza (flu) vaccine  This is recommended every year. Tetanus, diphtheria, and pertussis (Tdap) vaccine  You may need a Td booster every 10 years. Varicella (chickenpox) vaccine  You may need this vaccine if you have not already been vaccinated. Zoster (shingles)  vaccine  You may need this after age 29. Pneumococcal conjugate (PCV13) vaccine  One dose is recommended after age 57. Pneumococcal polysaccharide (PPSV23) vaccine  One dose is recommended after age 15. Measles, mumps, and rubella (MMR) vaccine  You may need at least one dose of MMR if you were born in 1957 or later. You may also need a second dose. Meningococcal conjugate (MenACWY) vaccine  You may need this if you have certain conditions. Hepatitis A vaccine  You may need this if you have certain conditions or if you travel or work in places where you may be exposed to hepatitis A. Hepatitis B vaccine  You may need this if you have certain conditions or if you travel or work in places where you may be exposed to hepatitis B. Haemophilus influenzae type b (Hib) vaccine  You may need this if you have certain conditions. You may receive vaccines as individual doses or as more than one vaccine together in one shot (combination vaccines). Talk with your health care provider about the risks and benefits of combination vaccines. What tests do I need? Blood tests  Lipid and cholesterol levels. These may  be checked every 5 years, or more frequently depending on your overall health.  Hepatitis C test.  Hepatitis B test. Screening  Lung cancer screening. You may have this screening every year starting at age 61 if you have a 30-pack-year history of smoking and currently smoke or have quit within the past 15 years.  Colorectal cancer screening. All adults should have this screening starting at age 79 and continuing until age 57. Your health care provider may recommend screening at age 79 if you are at increased risk. You will have tests every 1-10 years, depending on your results and the type of screening test.  Diabetes screening. This is done by checking your blood sugar (glucose) after you have not eaten for a while (fasting). You may have this done every 1-3 years.  Mammogram. This may be done every 1-2 years. Talk with your health care provider about how often you should have regular mammograms.  BRCA-related cancer screening. This may be done if you have a family history of breast, ovarian, tubal, or peritoneal cancers. Other tests  Sexually transmitted disease (STD) testing.  Bone density scan. This is done to screen for osteoporosis. You may have this done starting at age 62. Follow these instructions at home: Eating and drinking  Eat a diet that includes fresh fruits and vegetables, whole grains, lean protein, and low-fat dairy products. Limit your intake of foods with high amounts of sugar, saturated fats, and salt.  Take vitamin and mineral supplements as recommended by your health care provider.  Do not drink alcohol if your health care provider tells you not to drink.  If you drink alcohol: ? Limit how much you have to 0-1 drink a day. ? Be aware of how much alcohol is in your drink. In the [place] one drink equals one 12 oz bottle of beer (355 mL), one 5 oz glass of wine (148 mL), or one 1 oz glass of hard liquor (44 mL). Lifestyle  Take daily care of your teeth and gums.   Stay active. Exercise for at least 30 minutes on 5 or more days each week.  Do not use any products that contain nicotine or tobacco, such as cigarettes, e-cigarettes, and chewing tobacco. If you need help quitting, ask your health care provider.  If you are sexually active, practice safe sex.  Use a condom or other form of protection in order to prevent STIs (sexually transmitted infections).  Talk with your health care provider about taking a low-dose aspirin or statin. What's next?  Go to your health care provider once a year for a well check visit.  Ask your health care provider how often you should have your eyes and teeth checked.  Stay up to date on all vaccines. This information is not intended to replace advice given to you by your health care provider. Make sure you discuss any questions you have with your health care provider. Document Released: 07/24/2015 Document Revised: 06/21/2018 Document Reviewed: 06/21/2018 Elsevier Patient Education  2020 [employer].

## 2019-04-30 ENCOUNTER — Encounter: Payer: Self-pay | Admitting: Family Medicine

## 2019-04-30 LAB — CMP14+EGFR
ALT: 15 IU/L (ref 0–32)
AST: 15 IU/L (ref 0–40)
Albumin/Globulin Ratio: 1.9 (ref 1.2–2.2)
Albumin: 4.2 g/dL (ref 3.8–4.8)
Alkaline Phosphatase: 100 IU/L (ref 39–117)
BUN/Creatinine Ratio: 26 (ref 12–28)
BUN: 14 mg/dL (ref 8–27)
Bilirubin Total: 0.3 mg/dL (ref 0.0–1.2)
CO2: 25 mmol/L (ref 20–29)
Calcium: 9.1 mg/dL (ref 8.7–10.3)
Chloride: 101 mmol/L (ref 96–106)
Creatinine, Ser: 0.53 mg/dL — ABNORMAL LOW (ref 0.57–1.00)
GFR calc Af Amer: 115 mL/min/{1.73_m2} (ref >59)
GFR calc non Af Amer: 100 mL/min/{1.73_m2} (ref >59)
Globulin, Total: 2.2 g/dL (ref 1.5–4.5)
Glucose: 109 mg/dL — ABNORMAL HIGH (ref 65–99)
Potassium: 4.1 mmol/L (ref 3.5–5.2)
Sodium: 140 mmol/L (ref 134–144)
Total Protein: 6.4 g/dL (ref 6.0–8.5)

## 2019-04-30 LAB — LIPID PANEL
Chol/HDL Ratio: 2.9 ratio (ref 0.0–4.4)
Cholesterol, Total: 191 mg/dL (ref 100–199)
HDL: 67 mg/dL (ref >39)
LDL Chol Calc (NIH): 97 mg/dL (ref 0–99)
Triglycerides: 160 mg/dL — ABNORMAL HIGH (ref 0–149)
VLDL Cholesterol Cal: 27 mg/dL (ref 5–40)

## 2019-04-30 LAB — CBC WITH DIFFERENTIAL/PLATELET
Basophils Absolute: 0 10*3/uL (ref 0.0–0.2)
Basos: 0 %
EOS (ABSOLUTE): 0.2 10*3/uL (ref 0.0–0.4)
Eos: 3 %
Hematocrit: 40.7 % (ref 34.0–46.6)
Hemoglobin: 13.6 g/dL (ref 11.1–15.9)
Immature Grans (Abs): 0 10*3/uL (ref 0.0–0.1)
Immature Granulocytes: 0 %
Lymphocytes Absolute: 1.6 10*3/uL (ref 0.7–3.1)
Lymphs: 22 %
MCH: 30.3 pg (ref 26.6–33.0)
MCHC: 33.4 g/dL (ref 31.5–35.7)
MCV: 91 fL (ref 79–97)
Monocytes Absolute: 0.6 10*3/uL (ref 0.1–0.9)
Monocytes: 9 %
Neutrophils Absolute: 4.7 10*3/uL (ref 1.4–7.0)
Neutrophils: 66 %
Platelets: 305 10*3/uL (ref 150–450)
RBC: 4.49 x10E6/uL (ref 3.77–5.28)
RDW: 12.7 % (ref 11.7–15.4)
WBC: 7.1 10*3/uL (ref 3.4–10.8)

## 2019-04-30 LAB — HEPATITIS C ANTIBODY: Hep C Virus Ab: <0.1 s/co ratio (ref 0.0–0.9)

## 2019-05-01 ENCOUNTER — Encounter: Payer: Self-pay | Admitting: Family Medicine

## 2019-05-02 ENCOUNTER — Telehealth: Payer: Self-pay | Admitting: Adult Health

## 2019-05-02 ENCOUNTER — Telehealth: Payer: Self-pay | Admitting: *Deleted

## 2019-05-02 NOTE — Telephone Encounter (Signed)
Left message I called 

## 2019-05-02 NOTE — Telephone Encounter (Signed)
Pt got estrace, used good RX

## 2019-05-02 NOTE — Telephone Encounter (Signed)
Pt left message that she needs Anderson Malta to call her back about the estrace prescription.

## 2019-06-18 ENCOUNTER — Telehealth: Payer: Self-pay | Admitting: *Deleted

## 2019-06-18 NOTE — Telephone Encounter (Signed)
Received fax stating Estradiol 1 mg, use as directed, is approved through 07/10/2020. Ref # [id number]. [place]

## 2019-07-04 ENCOUNTER — Other Ambulatory Visit: Payer: Self-pay | Admitting: Adult Health

## 2019-08-08 ENCOUNTER — Ambulatory Visit (INDEPENDENT_AMBULATORY_CARE_PROVIDER_SITE_OTHER): Payer: Medicare PPO

## 2019-08-08 ENCOUNTER — Other Ambulatory Visit: Payer: Self-pay

## 2019-08-08 DIAGNOSIS — Z780 Asymptomatic menopausal state: Secondary | ICD-10-CM

## 2019-08-08 DIAGNOSIS — M8580 Other specified disorders of bone density and structure, unspecified site: Secondary | ICD-10-CM

## 2019-08-08 HISTORY — DX: Other specified disorders of bone density and structure, unspecified site: M85.80

## 2019-08-09 ENCOUNTER — Encounter: Payer: Self-pay | Admitting: Family Medicine

## 2019-08-29 ENCOUNTER — Telehealth: Payer: Self-pay | Admitting: Family Medicine

## 2019-08-29 NOTE — Telephone Encounter (Signed)
Patient had Cologuard ordered on 04/29/2019.  I cannot see where this was entered into Colog[employer]ard's website.  Can we please follow-up on this?

## 2019-08-30 NOTE — Telephone Encounter (Signed)
Ordered on cologaurd website

## 2019-10-10 LAB — COLOGUARD: COLOGUARD: NEGATIVE

## 2019-10-11 ENCOUNTER — Other Ambulatory Visit: Payer: Self-pay | Admitting: Adult Health

## 2019-10-11 DIAGNOSIS — Z79899 Other long term (current) drug therapy: Secondary | ICD-10-CM

## 2019-10-15 LAB — COLOGUARD: Cologuard: NEGATIVE

## 2019-11-07 ENCOUNTER — Encounter: Payer: Self-pay | Admitting: Family Medicine

## 2019-11-07 ENCOUNTER — Telehealth (INDEPENDENT_AMBULATORY_CARE_PROVIDER_SITE_OTHER): Payer: Medicare PPO | Admitting: Family Medicine

## 2019-11-07 DIAGNOSIS — R3989 Other symptoms and signs involving the genitourinary system: Secondary | ICD-10-CM

## 2019-11-07 MED ORDER — PHENAZOPYRIDINE HCL 200 MG PO TABS: 200.0000 mg | ORAL_TABLET | Freq: Three times a day (TID) | ORAL | 0 refills | Status: AC | PRN

## 2019-11-07 MED ORDER — CEPHALEXIN 500 MG PO CAPS: 500.0000 mg | ORAL_CAPSULE | Freq: Two times a day (BID) | ORAL | 0 refills | Status: AC

## 2019-11-07 NOTE — Patient Instructions (Signed)
Urinary Tract Infection, Adult A urinary tract infection (UTI) is an infection of any part of the urinary tract. The urinary tract includes:  The kidneys.  The ureters.  The bladder.  The urethra. These organs make, store, and get rid of pee (urine) in the body. What are the causes? This is caused by germs (bacteria) in your genital area. These germs grow and cause swelling (inflammation) of your urinary tract. What increases the risk? You are more likely to develop this condition if:  You have a small, thin tube (catheter) to drain pee.  You cannot control when you pee or poop (incontinence).  You are female, and: ? You use these methods to prevent pregnancy:  A medicine that kills sperm (spermicide).  A device that blocks sperm (diaphragm). ? You have low levels of a female hormone (estrogen). ? You are pregnant.  You have genes that add to your risk.  You are sexually active.  You take antibiotic medicines.  You have trouble peeing because of: ? A prostate that is bigger than normal, if you are female. ? A blockage in the part of your body that drains pee from the bladder (urethra). ? A kidney stone. ? A nerve condition that affects your bladder (neurogenic bladder). ? Not getting enough to drink. ? Not peeing often enough.  You have other conditions, such as: ? Diabetes. ? A weak disease-fighting system (immune system). ? Sickle cell disease. ? Gout. ? Injury of the spine. What are the signs or symptoms? Symptoms of this condition include:  Needing to pee right away (urgently).  Peeing often.  Peeing small amounts often.  Pain or burning when peeing.  Blood in the pee.  Pee that smells bad or not like normal.  Trouble peeing.  Pee that is cloudy.  Fluid coming from the vagina, if you are female.  Pain in the belly or lower back. Other symptoms include:  Throwing up (vomiting).  No urge to eat.  Feeling mixed up (confused).  Being tired  and grouchy (irritable).  A fever.  Watery poop (diarrhea). How is this treated? This condition may be treated with:  Antibiotic medicine.  Other medicines.  Drinking enough water. Follow these instructions at home:  Medicines  Take over-the-counter and prescription medicines only as told by your doctor.  If you were prescribed an antibiotic medicine, take it as told by your doctor. Do not stop taking it even if you start to feel better. General instructions  Make sure you: ? Pee until your bladder is empty. ? Do not hold pee for a long time. ? Empty your bladder after sex. ? Wipe from front to back after pooping if you are a female. Use each tissue one time when you wipe.  Drink enough fluid to keep your pee pale yellow.  Keep all follow-up visits as told by your doctor. This is important. Contact a doctor if:  You do not get better after 1-2 days.  Your symptoms go away and then come back. Get help right away if:  You have very bad back pain.  You have very bad pain in your lower belly.  You have a fever.  You are sick to your stomach (nauseous).  You are throwing up. Summary  A urinary tract infection (UTI) is an infection of any part of the urinary tract.  This condition is caused by germs in your genital area.  There are many risk factors for a UTI. These include having a small, thin   tube to drain pee and not being able to control when you pee or poop.  Treatment includes antibiotic medicines for germs.  Drink enough fluid to keep your pee pale yellow. This information is not intended to replace advice given to you by your health care provider. Make sure you discuss any questions you have with your health care provider. Document Revised: 06/14/2018 Document Reviewed: 01/04/2018 Elsevier Patient Education  2020 [employer]  

## 2019-11-07 NOTE — Progress Notes (Signed)
Telephone visit  Subjective: CC: UTI PCP: Gwenlyn Fudge, FNP Julia Harmon is a 64 y.o. female calls for telephone consult today. Patient provides verbal consent for consult held via phone.  Due to COVID-19 pandemic this visit was conducted virtually. This visit type was conducted due to national recommendations for restrictions regarding the COVID-19 Pandemic (e.g. social distancing, sheltering in place) in an effort to limit this patient's exposure and mitigate transmission in our community. All issues noted in this document were discussed and addressed.  A physical exam was not performed with this format.   Location of patient: home Location of provider: Working remotely from home Others present for call: none  1. Urinary symptoms Patient reports a 1 day h/o urinary frequency, urgency.  She is having mild dysuria.  No hematuria, fevers, chills, abdominal pain, nausea, vomiting, back pain, vaginal discharge.  She is hydrating well.  Patient has used nothing for symptoms.  Patient denies a h/o frequent or recurrent UTIs.      ROS: Per HPI  No Known Allergies Past Medical History:  Diagnosis Date  . Achilles tendinitis   . Current use of estrogen therapy 10/22/2013  . Endometriosis   . HSV-1 (herpes simplex virus 1) infection   . Hypertension   . Osteopenia 08/08/2019    Current Outpatient Medications:  .  estradiol (ESTRACE) 1 MG tablet, TAKE 1 TABLET BY MOUTH EVERY DAY, Disp: 30 tablet, Rfl: 12 .  loratadine (CLARITIN) 10 MG tablet, Take 10 mg by mouth daily., Disp: , Rfl:  .  losartan-hydrochlorothiazide (HYZAAR) 50-12.5 MG tablet, TAKE 1 TABLET BY MOUTH DAILY, Disp: 30 tablet, Rfl: 6  Assessment/ Plan: 64 y.o. female   1. Suspected UTI Empiric treatment with Keflex, Pyridium.  Push fluids.  Return precautions discussed.  Follow up prn. - cephALEXin (KEFLEX) 500 MG capsule; Take 1 capsule (500 mg total) by mouth 2 (two) times daily for 7 days.  Dispense: 14 capsule;  Refill: 0 - phenazopyridine (PYRIDIUM) 200 MG tablet; Take 1 tablet (200 mg total) by mouth 3 (three) times daily as needed for up to 2 days for pain (urinary).  Dispense: 6 tablet; Refill: 0   Start time: 11:44am End time: 11:48am  Total time spent on patient care (including telephone call/ virtual visit): 11 minutes  Luther Newhouse Hulen Skains, DO [facility] [phone]

## 2020-01-29 ENCOUNTER — Other Ambulatory Visit: Payer: Self-pay | Admitting: Adult Health

## 2020-03-27 ENCOUNTER — Other Ambulatory Visit: Payer: Self-pay | Admitting: Obstetrics and Gynecology

## 2020-03-27 DIAGNOSIS — Z1231 Encounter for screening mammogram for malignant neoplasm of breast: Secondary | ICD-10-CM

## 2020-03-31 DIAGNOSIS — M47812 Spondylosis without myelopathy or radiculopathy, cervical region: Secondary | ICD-10-CM | POA: Diagnosis not present

## 2020-03-31 DIAGNOSIS — M9901 Segmental and somatic dysfunction of cervical region: Secondary | ICD-10-CM | POA: Diagnosis not present

## 2020-03-31 DIAGNOSIS — M9902 Segmental and somatic dysfunction of thoracic region: Secondary | ICD-10-CM | POA: Diagnosis not present

## 2020-03-31 DIAGNOSIS — S338XXA Sprain of other parts of lumbar spine and pelvis, initial encounter: Secondary | ICD-10-CM | POA: Diagnosis not present

## 2020-03-31 DIAGNOSIS — M542 Cervicalgia: Secondary | ICD-10-CM | POA: Diagnosis not present

## 2020-03-31 DIAGNOSIS — M9903 Segmental and somatic dysfunction of lumbar region: Secondary | ICD-10-CM | POA: Diagnosis not present

## 2020-03-31 DIAGNOSIS — M546 Pain in thoracic spine: Secondary | ICD-10-CM | POA: Diagnosis not present

## 2020-04-10 ENCOUNTER — Other Ambulatory Visit: Payer: Self-pay

## 2020-04-10 ENCOUNTER — Ambulatory Visit
Admission: RE | Admit: 2020-04-10 | Discharge: 2020-04-10 | Disposition: A | Discharge status: Home / Self Care | Payer: Medicare PPO | Source: Ambulatory Visit | Attending: Obstetrics and Gynecology | Admitting: Obstetrics and Gynecology

## 2020-04-10 DIAGNOSIS — Z1231 Encounter for screening mammogram for malignant neoplasm of breast: Secondary | ICD-10-CM | POA: Diagnosis not present

## 2020-04-14 DIAGNOSIS — M9902 Segmental and somatic dysfunction of thoracic region: Secondary | ICD-10-CM | POA: Diagnosis not present

## 2020-04-14 DIAGNOSIS — M9901 Segmental and somatic dysfunction of cervical region: Secondary | ICD-10-CM | POA: Diagnosis not present

## 2020-04-14 DIAGNOSIS — S338XXA Sprain of other parts of lumbar spine and pelvis, initial encounter: Secondary | ICD-10-CM | POA: Diagnosis not present

## 2020-04-14 DIAGNOSIS — M9903 Segmental and somatic dysfunction of lumbar region: Secondary | ICD-10-CM | POA: Diagnosis not present

## 2020-04-14 DIAGNOSIS — M546 Pain in thoracic spine: Secondary | ICD-10-CM | POA: Diagnosis not present

## 2020-04-14 DIAGNOSIS — M542 Cervicalgia: Secondary | ICD-10-CM | POA: Diagnosis not present

## 2020-04-14 DIAGNOSIS — M47812 Spondylosis without myelopathy or radiculopathy, cervical region: Secondary | ICD-10-CM | POA: Diagnosis not present

## 2020-08-26 ENCOUNTER — Other Ambulatory Visit: Payer: Self-pay | Admitting: Adult Health

## 2020-09-07 ENCOUNTER — Other Ambulatory Visit: Payer: Self-pay | Admitting: Adult Health

## 2020-10-14 ENCOUNTER — Ambulatory Visit: Payer: Medicare PPO | Admitting: Family Medicine

## 2020-10-14 ENCOUNTER — Encounter: Payer: Self-pay | Admitting: Family Medicine

## 2020-10-14 DIAGNOSIS — R300 Dysuria: Secondary | ICD-10-CM | POA: Diagnosis not present

## 2020-10-14 MED ORDER — SULFAMETHOXAZOLE-TRIMETHOPRIM 800-160 MG PO TABS
1.0000 | ORAL_TABLET | Freq: Two times a day (BID) | ORAL | 0 refills | Status: DC
Start: 2020-10-14 — End: 2021-11-11

## 2020-10-14 NOTE — Progress Notes (Signed)
Subjective:    Patient ID: Julia Harmon, female    DOB: [birth year], 65 y.o.   MRN: [id number]   HPI: Julia Harmon is a 65 y.o. female presenting for two weeks of discomfort with urination and frequency. Denies fever . No flank pain. No nausea, vomiting. Had an infection with similar symptoms  that responded well to treatment last year in April.     Depression screen [facility] 2/9 04/29/2019 10/16/2017 04/25/2017 09/06/2016  Decreased Interest 0 0 0 0  Down, Depressed, Hopeless 0 0 0 0  PHQ - 2 Score 0 0 0 0  Altered sleeping 0 - - -  Tired, decreased energy 0 - - -  Change in appetite 0 - - -  Feeling bad or failure about yourself  0 - - -  Trouble concentrating 0 - - -  Moving slowly or fidgety/restless 0 - - -  Suicidal thoughts 0 - - -  PHQ-9 Score 0 - - -     Relevant past medical, surgical, family and social history reviewed and updated as indicated.  Interim medical history since our last visit reviewed. Allergies and medications reviewed and updated.  ROS:  Review of Systems  Constitutional: Negative for chills, diaphoresis and fever.  HENT: Negative for congestion.   Eyes: Negative for visual disturbance.  Respiratory: Negative for cough and shortness of breath.   Cardiovascular: Negative for chest pain and palpitations.  Gastrointestinal: Negative for constipation, diarrhea and nausea.  Genitourinary: Positive for dysuria, frequency and urgency. Negative for decreased urine volume, flank pain, hematuria, menstrual problem and pelvic pain.  Musculoskeletal: Negative for arthralgias and joint swelling.  Skin: Negative for rash.  Neurological: Negative for dizziness and numbness.     Social History   Tobacco Use  Smoking Status Former Smoker  . Years: 10.00  . Types: Cigarettes  Smokeless Tobacco Never Used       Objective:     Wt Readings from Last 3 Encounters:  04/29/19 161 lb (73 kg)  10/16/17 160 lb (72.6 kg)  04/25/17 161 lb (73 kg)     Exam  deferred. Pt. Harboring due to COVID 19. Phone visit performed.   Assessment & Plan:   1. Dysuria     Meds ordered this encounter  Medications  . sulfamethoxazole-trimethoprim (BACTRIM DS) 800-160 MG tablet    Sig: Take 1 tablet by mouth 2 (two) times daily.    Dispense:  14 tablet    Refill:  0    Orders Placed This Encounter  Procedures  . Urine Culture  . Urinalysis      Diagnoses and all orders for this visit:  Dysuria -     Urinalysis -     Urine Culture  Other orders -     sulfamethoxazole-trimethoprim (BACTRIM DS) 800-160 MG tablet; Take 1 tablet by mouth 2 (two) times daily.    Virtual Visit via telephone Note  I discussed the limitations, risks, security and privacy concerns of performing an evaluation and management service by telephone and the availability of in person appointments. The patient was identified with two identifiers. Pt.expressed understanding and agreed to proceed. Pt. Is at home. Dr. Darlyn Read is in his office.  Follow Up Instructions:   I discussed the assessment and treatment plan with the patient. The patient was provided an opportunity to ask questions and all were answered. The patient agreed with the plan and demonstrated an understanding of the instructions.   The patient was advised to call  back or seek an in-person evaluation if the symptoms worsen or if the condition fails to improve as anticipated.   Total minutes including chart review and phone contact time: 14   Follow up plan: Return if symptoms worsen or fail to improve.  Mechele Claude, MD [facility]

## 2020-10-15 ENCOUNTER — Other Ambulatory Visit: Payer: Medicare PPO

## 2020-10-15 DIAGNOSIS — R300 Dysuria: Secondary | ICD-10-CM | POA: Diagnosis not present

## 2020-10-15 LAB — URINALYSIS
Bilirubin, UA: NEGATIVE
Glucose, UA: NEGATIVE
Ketones, UA: NEGATIVE
Leukocytes,UA: NEGATIVE
Nitrite, UA: NEGATIVE
Protein,UA: NEGATIVE
Specific Gravity, UA: 1.015 (ref 1.005–1.030)
Urobilinogen, Ur: 0.2 mg/dL (ref 0.2–1.0)
pH, UA: 6 (ref 5.0–7.5)

## 2020-10-17 LAB — URINE CULTURE

## 2020-10-26 ENCOUNTER — Other Ambulatory Visit: Payer: Self-pay | Admitting: Adult Health

## 2020-10-26 DIAGNOSIS — Z79818 Long term (current) use of other agents affecting estrogen receptors and estrogen levels: Secondary | ICD-10-CM

## 2020-10-26 DIAGNOSIS — Z79899 Other long term (current) drug therapy: Secondary | ICD-10-CM

## 2020-11-10 DIAGNOSIS — M47812 Spondylosis without myelopathy or radiculopathy, cervical region: Secondary | ICD-10-CM | POA: Diagnosis not present

## 2020-11-10 DIAGNOSIS — M542 Cervicalgia: Secondary | ICD-10-CM | POA: Diagnosis not present

## 2020-11-10 DIAGNOSIS — M546 Pain in thoracic spine: Secondary | ICD-10-CM | POA: Diagnosis not present

## 2020-11-10 DIAGNOSIS — S338XXA Sprain of other parts of lumbar spine and pelvis, initial encounter: Secondary | ICD-10-CM | POA: Diagnosis not present

## 2020-11-10 DIAGNOSIS — M9903 Segmental and somatic dysfunction of lumbar region: Secondary | ICD-10-CM | POA: Diagnosis not present

## 2020-11-10 DIAGNOSIS — M9902 Segmental and somatic dysfunction of thoracic region: Secondary | ICD-10-CM | POA: Diagnosis not present

## 2020-11-10 DIAGNOSIS — M9901 Segmental and somatic dysfunction of cervical region: Secondary | ICD-10-CM | POA: Diagnosis not present

## 2020-11-12 DIAGNOSIS — S338XXA Sprain of other parts of lumbar spine and pelvis, initial encounter: Secondary | ICD-10-CM | POA: Diagnosis not present

## 2020-11-12 DIAGNOSIS — M9903 Segmental and somatic dysfunction of lumbar region: Secondary | ICD-10-CM | POA: Diagnosis not present

## 2020-11-12 DIAGNOSIS — M542 Cervicalgia: Secondary | ICD-10-CM | POA: Diagnosis not present

## 2020-11-12 DIAGNOSIS — M47812 Spondylosis without myelopathy or radiculopathy, cervical region: Secondary | ICD-10-CM | POA: Diagnosis not present

## 2020-11-12 DIAGNOSIS — M9901 Segmental and somatic dysfunction of cervical region: Secondary | ICD-10-CM | POA: Diagnosis not present

## 2020-11-12 DIAGNOSIS — M9902 Segmental and somatic dysfunction of thoracic region: Secondary | ICD-10-CM | POA: Diagnosis not present

## 2020-11-12 DIAGNOSIS — M546 Pain in thoracic spine: Secondary | ICD-10-CM | POA: Diagnosis not present

## 2020-11-16 DIAGNOSIS — M546 Pain in thoracic spine: Secondary | ICD-10-CM | POA: Diagnosis not present

## 2020-11-16 DIAGNOSIS — M9902 Segmental and somatic dysfunction of thoracic region: Secondary | ICD-10-CM | POA: Diagnosis not present

## 2020-11-16 DIAGNOSIS — M9901 Segmental and somatic dysfunction of cervical region: Secondary | ICD-10-CM | POA: Diagnosis not present

## 2020-11-16 DIAGNOSIS — M47812 Spondylosis without myelopathy or radiculopathy, cervical region: Secondary | ICD-10-CM | POA: Diagnosis not present

## 2020-11-16 DIAGNOSIS — M9903 Segmental and somatic dysfunction of lumbar region: Secondary | ICD-10-CM | POA: Diagnosis not present

## 2020-11-16 DIAGNOSIS — S338XXA Sprain of other parts of lumbar spine and pelvis, initial encounter: Secondary | ICD-10-CM | POA: Diagnosis not present

## 2020-11-16 DIAGNOSIS — M542 Cervicalgia: Secondary | ICD-10-CM | POA: Diagnosis not present

## 2021-02-22 ENCOUNTER — Ambulatory Visit: Payer: Medicare PPO | Admitting: Nurse Practitioner

## 2021-02-22 ENCOUNTER — Encounter: Payer: Self-pay | Admitting: Nurse Practitioner

## 2021-02-22 DIAGNOSIS — J0111 Acute recurrent frontal sinusitis: Secondary | ICD-10-CM | POA: Diagnosis not present

## 2021-02-22 MED ORDER — AMOXICILLIN-POT CLAVULANATE 875-125 MG PO TABS: 1.0000 | ORAL_TABLET | Freq: Two times a day (BID) | ORAL | 0 refills | Status: DC

## 2021-02-22 NOTE — Progress Notes (Signed)
   Virtual Visit  Note Due to COVID-19 pandemic this visit was conducted virtually. This visit type was conducted due to national recommendations for restrictions regarding the COVID-19 Pandemic (e.g. social distancing, sheltering in place) in an effort to limit this patient's exposure and mitigate transmission in our community. All issues noted in this document were discussed and addressed.  A physical exam was not performed with this format.  I connected with Julia Harmon on 02/22/21 at 10:15 am by telephone and verified that I am speaking with the correct person using two identifiers. Julia Harmon is currently located at home during visit. The provider, Daryll Drown, NP is located in their office at time of visit.  I discussed the limitations, risks, security and privacy concerns of performing an evaluation and management service by telephone and the availability of in person appointments. I also discussed with the patient that there may be a patient responsible charge related to this service. The patient expressed understanding and agreed to proceed.   History and Present Illness:  Sinusitis This is a new problem. The current episode started yesterday. The problem has been gradually worsening since onset. There has been no fever. The pain is moderate. Associated symptoms include congestion, headaches, sinus pressure and a sore throat. Pertinent negatives include no chills, coughing or shortness of breath. Past treatments include nothing. The treatment provided no relief.     Review of Systems  Constitutional:  Negative for chills, fever and malaise/fatigue.  HENT:  Positive for congestion, sinus pressure, sinus pain and sore throat.   Respiratory:  Negative for cough and shortness of breath.   Skin:  Negative for rash.  Neurological:  Positive for headaches.  All other systems reviewed and are negative.   Observations/Objective: Tele-visit, patient not in distress  Assessment  and Plan:  At home  covid-19 test negative. Increase hydration, salt water gargle, chloraseptic throat spray,  Augmentin 875-125 mg tablet twice daily for 7 days. Tylenol or ibuprofen for pain or fever,   Follow Up Instructions:  Follow up with unresolved symptoms    I discussed the assessment and treatment plan with the patient. The patient was provided an opportunity to ask questions and all were answered. The patient agreed with the plan and demonstrated an understanding of the instructions.   The patient was advised to call back or seek an in-person evaluation if the symptoms worsen or if the condition fails to improve as anticipated.  The above assessment and management plan was discussed with the patient. The patient verbalized understanding of and has agreed to the management plan. Patient is aware to call the clinic if symptoms persist or worsen. Patient is aware when to return to the clinic for a follow-up visit. Patient educated on when it is appropriate to go to the emergency department.   Time call ended: 10: 35 am   I provided 10 minutes of  non face-to-face time during this encounter.    Daryll Drown, NP

## 2021-02-22 NOTE — Assessment & Plan Note (Signed)
At home  covid-19 test negative. Increase hydration, salt water gargle, chloraseptic throat spray,  Augmentin 875-125 mg tablet twice daily for 7 days. Tylenol or ibuprofen for pain or fever,

## 2021-03-22 ENCOUNTER — Other Ambulatory Visit: Payer: Self-pay | Admitting: Adult Health

## 2021-03-22 DIAGNOSIS — Z79890 Hormone replacement therapy (postmenopausal): Secondary | ICD-10-CM | POA: Diagnosis not present

## 2021-03-22 DIAGNOSIS — I10 Essential (primary) hypertension: Secondary | ICD-10-CM | POA: Diagnosis not present

## 2021-03-22 DIAGNOSIS — K219 Gastro-esophageal reflux disease without esophagitis: Secondary | ICD-10-CM | POA: Diagnosis not present

## 2021-03-22 DIAGNOSIS — R32 Unspecified urinary incontinence: Secondary | ICD-10-CM | POA: Diagnosis not present

## 2021-03-22 DIAGNOSIS — Z87891 Personal history of nicotine dependence: Secondary | ICD-10-CM | POA: Diagnosis not present

## 2021-04-20 DIAGNOSIS — M9903 Segmental and somatic dysfunction of lumbar region: Secondary | ICD-10-CM | POA: Diagnosis not present

## 2021-04-20 DIAGNOSIS — M542 Cervicalgia: Secondary | ICD-10-CM | POA: Diagnosis not present

## 2021-04-20 DIAGNOSIS — M47812 Spondylosis without myelopathy or radiculopathy, cervical region: Secondary | ICD-10-CM | POA: Diagnosis not present

## 2021-04-20 DIAGNOSIS — M546 Pain in thoracic spine: Secondary | ICD-10-CM | POA: Diagnosis not present

## 2021-04-20 DIAGNOSIS — S338XXA Sprain of other parts of lumbar spine and pelvis, initial encounter: Secondary | ICD-10-CM | POA: Diagnosis not present

## 2021-04-20 DIAGNOSIS — M9901 Segmental and somatic dysfunction of cervical region: Secondary | ICD-10-CM | POA: Diagnosis not present

## 2021-04-20 DIAGNOSIS — M9902 Segmental and somatic dysfunction of thoracic region: Secondary | ICD-10-CM | POA: Diagnosis not present

## 2021-04-23 DIAGNOSIS — M546 Pain in thoracic spine: Secondary | ICD-10-CM | POA: Diagnosis not present

## 2021-04-23 DIAGNOSIS — M9901 Segmental and somatic dysfunction of cervical region: Secondary | ICD-10-CM | POA: Diagnosis not present

## 2021-04-23 DIAGNOSIS — M9903 Segmental and somatic dysfunction of lumbar region: Secondary | ICD-10-CM | POA: Diagnosis not present

## 2021-04-23 DIAGNOSIS — M9902 Segmental and somatic dysfunction of thoracic region: Secondary | ICD-10-CM | POA: Diagnosis not present

## 2021-04-23 DIAGNOSIS — M47812 Spondylosis without myelopathy or radiculopathy, cervical region: Secondary | ICD-10-CM | POA: Diagnosis not present

## 2021-04-23 DIAGNOSIS — M542 Cervicalgia: Secondary | ICD-10-CM | POA: Diagnosis not present

## 2021-04-23 DIAGNOSIS — S338XXA Sprain of other parts of lumbar spine and pelvis, initial encounter: Secondary | ICD-10-CM | POA: Diagnosis not present

## 2021-05-03 DIAGNOSIS — M9903 Segmental and somatic dysfunction of lumbar region: Secondary | ICD-10-CM | POA: Diagnosis not present

## 2021-05-03 DIAGNOSIS — M9901 Segmental and somatic dysfunction of cervical region: Secondary | ICD-10-CM | POA: Diagnosis not present

## 2021-05-03 DIAGNOSIS — M546 Pain in thoracic spine: Secondary | ICD-10-CM | POA: Diagnosis not present

## 2021-05-03 DIAGNOSIS — S338XXA Sprain of other parts of lumbar spine and pelvis, initial encounter: Secondary | ICD-10-CM | POA: Diagnosis not present

## 2021-05-03 DIAGNOSIS — M47812 Spondylosis without myelopathy or radiculopathy, cervical region: Secondary | ICD-10-CM | POA: Diagnosis not present

## 2021-05-03 DIAGNOSIS — M9902 Segmental and somatic dysfunction of thoracic region: Secondary | ICD-10-CM | POA: Diagnosis not present

## 2021-05-03 DIAGNOSIS — M542 Cervicalgia: Secondary | ICD-10-CM | POA: Diagnosis not present

## 2021-05-17 DIAGNOSIS — M9903 Segmental and somatic dysfunction of lumbar region: Secondary | ICD-10-CM | POA: Diagnosis not present

## 2021-05-17 DIAGNOSIS — M9901 Segmental and somatic dysfunction of cervical region: Secondary | ICD-10-CM | POA: Diagnosis not present

## 2021-05-17 DIAGNOSIS — S338XXA Sprain of other parts of lumbar spine and pelvis, initial encounter: Secondary | ICD-10-CM | POA: Diagnosis not present

## 2021-05-17 DIAGNOSIS — M9902 Segmental and somatic dysfunction of thoracic region: Secondary | ICD-10-CM | POA: Diagnosis not present

## 2021-05-17 DIAGNOSIS — M47812 Spondylosis without myelopathy or radiculopathy, cervical region: Secondary | ICD-10-CM | POA: Diagnosis not present

## 2021-05-17 DIAGNOSIS — M546 Pain in thoracic spine: Secondary | ICD-10-CM | POA: Diagnosis not present

## 2021-05-17 DIAGNOSIS — M542 Cervicalgia: Secondary | ICD-10-CM | POA: Diagnosis not present

## 2021-06-14 DIAGNOSIS — M546 Pain in thoracic spine: Secondary | ICD-10-CM | POA: Diagnosis not present

## 2021-06-14 DIAGNOSIS — M9901 Segmental and somatic dysfunction of cervical region: Secondary | ICD-10-CM | POA: Diagnosis not present

## 2021-06-14 DIAGNOSIS — M47812 Spondylosis without myelopathy or radiculopathy, cervical region: Secondary | ICD-10-CM | POA: Diagnosis not present

## 2021-06-14 DIAGNOSIS — M9902 Segmental and somatic dysfunction of thoracic region: Secondary | ICD-10-CM | POA: Diagnosis not present

## 2021-06-14 DIAGNOSIS — S338XXA Sprain of other parts of lumbar spine and pelvis, initial encounter: Secondary | ICD-10-CM | POA: Diagnosis not present

## 2021-06-14 DIAGNOSIS — M542 Cervicalgia: Secondary | ICD-10-CM | POA: Diagnosis not present

## 2021-06-14 DIAGNOSIS — M9903 Segmental and somatic dysfunction of lumbar region: Secondary | ICD-10-CM | POA: Diagnosis not present

## 2021-07-12 DIAGNOSIS — M9901 Segmental and somatic dysfunction of cervical region: Secondary | ICD-10-CM | POA: Diagnosis not present

## 2021-07-12 DIAGNOSIS — M47812 Spondylosis without myelopathy or radiculopathy, cervical region: Secondary | ICD-10-CM | POA: Diagnosis not present

## 2021-07-12 DIAGNOSIS — M9902 Segmental and somatic dysfunction of thoracic region: Secondary | ICD-10-CM | POA: Diagnosis not present

## 2021-07-12 DIAGNOSIS — M542 Cervicalgia: Secondary | ICD-10-CM | POA: Diagnosis not present

## 2021-07-12 DIAGNOSIS — S338XXA Sprain of other parts of lumbar spine and pelvis, initial encounter: Secondary | ICD-10-CM | POA: Diagnosis not present

## 2021-07-12 DIAGNOSIS — M9903 Segmental and somatic dysfunction of lumbar region: Secondary | ICD-10-CM | POA: Diagnosis not present

## 2021-07-12 DIAGNOSIS — M546 Pain in thoracic spine: Secondary | ICD-10-CM | POA: Diagnosis not present

## 2021-08-03 ENCOUNTER — Ambulatory Visit (INDEPENDENT_AMBULATORY_CARE_PROVIDER_SITE_OTHER): Payer: Medicare PPO

## 2021-08-03 VITALS — Ht 64.0 in | Wt 161.0 lb

## 2021-08-03 DIAGNOSIS — Z0000 Encounter for general adult medical examination without abnormal findings: Secondary | ICD-10-CM | POA: Diagnosis not present

## 2021-08-03 NOTE — Progress Notes (Signed)
Subjective:   Julia Harmon is a 66 y.o. female who presents for an Initial Medicare Annual Wellness Visit.  Virtual Visit via Telephone Note  I connected with  Julia Harmon on 08/03/21 at 10:30 AM EST by telephone and verified that I am speaking with the correct person using two identifiers.  Location: Patient: Home Provider: [facility] Persons participating in the virtual visit: patient/Nurse Health Advisor   I discussed the limitations, risks, security and privacy concerns of performing an evaluation and management service by telephone and the availability of in person appointments. The patient expressed understanding and agreed to proceed.  Interactive audio and video telecommunications were attempted between this nurse and patient, however failed, due to patient having technical difficulties OR patient did not have access to video capability.  We continued and completed visit with audio only.  Some vital signs may be absent or patient reported.   Rumi Taras E Shamarr Faucett, LPN   Review of Systems     Cardiac Risk Factors include: advanced age (>35men, >47 women);sedentary lifestyle;hypertension     Objective:    Today's Vitals   08/03/21 1032  Weight: 161 lb (73 kg)  Height: 5\' 4"  (1.626 m)   Body mass index is 27.64 kg/m.  Advanced Directives 08/03/2021 01/17/2017 09/06/2016  Does Patient Have a Medical Advance Directive? Yes Yes Yes  Type of 09/08/2016 of [place];Living will Healthcare Power of Attorney -  Does patient want to make changes to medical advance directive? - No - Patient declined -  Copy of Healthcare Power of Attorney in Chart? No - copy requested No - copy requested -    Current Medications (verified) Outpatient Encounter Medications as of 08/03/2021  Medication Sig   amoxicillin-clavulanate (AUGMENTIN) 875-125 MG tablet Take 1 tablet by mouth 2 (two) times daily.   estradiol (ESTRACE) 1 MG tablet TAKE 1 TABLET BY MOUTH EVERY DAY    loratadine (CLARITIN) 10 MG tablet Take 10 mg by mouth daily.   losartan-hydrochlorothiazide (HYZAAR) 50-12.5 MG tablet TAKE 1 TABLET BY MOUTH DAILY   sulfamethoxazole-trimethoprim (BACTRIM DS) 800-160 MG tablet Take 1 tablet by mouth 2 (two) times daily.   No facility-administered encounter medications on file as of 08/03/2021.    Allergies (verified) Patient has no known allergies.   History: Past Medical History:  Diagnosis Date   Achilles tendinitis    Current use of estrogen therapy 10/22/2013   Endometriosis    HSV-1 (herpes simplex virus 1) infection    Hypertension    Osteopenia 08/08/2019   Past Surgical History:  Procedure Laterality Date   ABDOMINAL HYSTERECTOMY     ACHILLES TENDON REPAIR Left 2015   BILATERAL SALPINGOOPHORECTOMY     Family History  Problem Relation Age of Onset   Heart attack Mother    Lung cancer Father    Emphysema Brother    Bladder Cancer Maternal Aunt    Stroke Maternal Grandmother    Heart attack Paternal Grandmother    Social History   Socioeconomic History   Marital status: Married    Spouse name: Not on file   Number of children: Not on file   Years of education: Not on file   Highest education level: Not on file  Occupational History   Occupation: retired  Tobacco Use   Smoking status: Former    Years: 10.00    Types: Cigarettes   Smokeless tobacco: Never  Vaping Use   Vaping Use: Never used  Substance and Sexual Activity  Alcohol use: Yes    Alcohol/week: 2.0 standard drinks    Types: 2 Glasses of wine per week    Comment: occ   Drug use: No   Sexual activity: Yes    Birth control/protection: Surgical    Comment: hyst  Other Topics Concern   Not on file  Social History Narrative   Not on file   Social Determinants of Health   Financial Resource Strain: Low Risk    Difficulty of Paying Living Expenses: Not very hard  Food Insecurity: No Food Insecurity   Worried About Programme researcher, broadcasting/film/video in the Last Year:  Never true   Ran Out of Food in the Last Year: Never true  Transportation Needs: No Transportation Needs   Lack of Transportation (Medical): No   Lack of Transportation (Non-Medical): No  Physical Activity: Insufficiently Active   Days of Exercise per Week: 3 days   Minutes of Exercise per Session: 30 min  Stress: No Stress Concern Present   Feeling of Stress : Only a little  Social Connections: Unknown   Frequency of Communication with Friends and Family: More than three times a week   Frequency of Social Gatherings with Friends and Family: Once a week   Attends Religious Services: Patient refused   Database administrator or Organizations: No   Attends Banker Meetings: Patient refused   Marital Status: Married    Tobacco Counseling Counseling given: Not Answered   Clinical Intake:              How often do you need to have someone help you when you read instructions, pamphlets, or other written materials from your doctor or pharmacy?: (P) 1 - Never  Diabetic? no         Activities of Daily Living In your present state of health, do you have any difficulty performing the following activities: 08/02/2021  Hearing? N  Vision? N  Difficulty concentrating or making decisions? N  Walking or climbing stairs? N  Dressing or bathing? N  Doing errands, shopping? N  Preparing Food and eating ? N  Using the Toilet? N  In the past six months, have you accidently leaked urine? N  Do you have problems with loss of bowel control? N  Managing your Medications? N  Managing your Finances? N  Housekeeping or managing your Housekeeping? N  Some recent data might be hidden    Patient Care Team: Gwenlyn Fudge, FNP as PCP - General (Family Medicine) Adline Potter, NP as Nurse Practitioner (Obstetrics and Gynecology)  Indicate any recent Medical Services you may have received from other than [facility] providers in the past year (date may be approximate).      Assessment:   This is a routine wellness examination for Julia Harmon.  Hearing/Vision screen Hearing Screening - Comments:: Denies hearing difficulties  Vision Screening - Comments:: Wears rx glasses- up to date with annual eye exams with [facility]  Dietary issues and exercise activities discussed: Current Exercise Habits: Home exercise routine, Type of exercise: walking;stretching;yoga, Time (Minutes): 30, Frequency (Times/Week): 3, Weekly Exercise (Minutes/Week): 90, Intensity: Mild, Exercise limited by: orthopedic condition(s)   Goals Addressed             This Visit's Progress    DIET - EAT MORE FRUITS AND VEGETABLES         Depression Screen PHQ 2/9 Scores 08/03/2021 04/29/2019 10/16/2017 04/25/2017 09/06/2016  PHQ - 2 Score 1 0 0 0 0  PHQ-  9 Score - 0 - - -    Fall Risk Fall Risk  08/02/2021 04/29/2019  Falls in the past year? 0 0  Number falls in past yr: 0 -  Injury with Fall? 0 -  Risk for fall due to : Orthopedic patient -  Follow up Falls prevention discussed -    FALL RISK PREVENTION PERTAINING TO THE HOME:  Any stairs in or around the home? No  If so, are there any without handrails? No  Home free of loose throw rugs in walkways, pet beds, electrical cords, etc? Yes  Adequate lighting in your home to reduce risk of falls? Yes   ASSISTIVE DEVICES UTILIZED TO PREVENT FALLS:  Life alert? No  Use of a cane, walker or w/c? No  Grab bars in the bathroom? No  Shower chair or bench in shower? Yes  Elevated toilet seat or a handicapped toilet? Yes   TIMED UP AND GO:  Was the test performed? No . Telephonic visit  Cognitive Function:     6CIT Screen 08/03/2021  What Year? 0 points  What month? 0 points  What time? 0 points  Count back from 20 0 points  Months in reverse 0 points  Repeat phrase 0 points  Total Score 0    Immunizations Immunization History  Administered Date(s) Administered   Fluad Quad(high Dose 65+) 04/29/2019   Pneumococcal  Conjugate-13 04/29/2019   Zoster Recombinat (Shingrix) 12/08/2017    TDAP status: Up to date  Flu Vaccine status: Up to date  Pneumococcal vaccine status: Up to date  Covid-19 vaccine status: Completed vaccines  Qualifies for Shingles Vaccine? Yes   Zostavax completed Yes   Shingrix Completed?: Yes  Screening Tests Health Maintenance  Topic Date Due   COVID-19 Vaccine (1) Never done   TETANUS/TDAP  Never done   Zoster Vaccines- Shingrix (2 of 2) 02/02/2018   Pneumonia Vaccine 5165+ Years old (2 - PPSV23 if available, else PCV20) 04/28/2020   INFLUENZA VACCINE  02/08/2021   DEXA SCAN  08/07/2021   MAMMOGRAM  04/10/2022   Fecal DNA (Cologuard)  10/07/2022   Hepatitis C Screening  Completed   HPV VACCINES  Aged Out    Health Maintenance  Health Maintenance Due  Topic Date Due   COVID-19 Vaccine (1) Never done   TETANUS/TDAP  Never done   Zoster Vaccines- Shingrix (2 of 2) 02/02/2018   Pneumonia Vaccine 6565+ Years old (2 - PPSV23 if available, else PCV20) 04/28/2020   INFLUENZA VACCINE  02/08/2021    Colorectal cancer screening: Type of screening: Cologuard. Completed 10/07/2019. Repeat every 3 years  Mammogram status: Completed 04/10/2020. Repeat every year plans to make appt soon  Bone Density status: Completed 08/08/2019. Results reflect: Bone density results: OSTEOPENIA. Repeat every 2 years. Will make appt soon   Lung Cancer Screening: (Low Dose CT Chest recommended if Age 65-80 years, 30 pack-year currently smoking OR have quit w/in 15years.) does not qualify.  Additional Screening:  Hepatitis C Screening: does qualify; Completed 04/29/2019  Vision Screening: Recommended annual ophthalmology exams for early detection of glaucoma and other disorders of the eye. Is the patient up to date with their annual eye exam?  Yes  Who is the provider or what is the name of the office in which the patient attends annual eye exams? [facility] If pt is not established with  a provider, would they like to be referred to a provider to establish care? No .   Dental Screening: Recommended annual  dental exams for proper oral hygiene  Community Resource Referral / Chronic Care Management: CRR required this visit?  No   CCM required this visit?  No      Plan:     I have personally reviewed and noted the following in the patients chart:   Medical and social history Use of alcohol, tobacco or illicit drugs  Current medications and supplements including opioid prescriptions. Patient is not currently taking opioid prescriptions. Functional ability and status Nutritional status Physical activity Advanced directives List of other physicians Hospitalizations, surgeries, and ER visits in previous 12 months Vitals Screenings to include cognitive, depression, and falls Referrals and appointments  In addition, I have reviewed and discussed with patient certain preventive protocols, quality metrics, and best practice recommendations. A written personalized care plan for preventive services as well as general preventive health recommendations were provided to patient.     [place] Constablemy E Harl Wiechmann, LPN   [id number]/09601/24/2023   Nurse Notes: under a lot of stress right now because her sister in law is in hospice and they expect her to pass away any day now - when this is all over, she will make f/u appt and mammogram

## 2021-08-03 NOTE — Patient Instructions (Signed)
Ms. Julia Harmon , Thank you for taking time to come for your Medicare Wellness Visit. I appreciate your ongoing commitment to your health goals. Please review the following plan we discussed and let me know if I can assist you in the future.   Screening recommendations/referrals: Colonoscopy: Cologuard don 10/07/2019 - repeat in 3 years Mammogram: Done 04/10/2020 - Repeat annually *due Bone Density: Done 08/08/2019 - Repeat every 2 years *due soon Recommended yearly ophthalmology/optometry visit for glaucoma screening and checkup Recommended yearly dental visit for hygiene and checkup  Vaccinations: vaccines complete at [facility] - We need records Influenza vaccine: Done - Repeat annually  Pneumococcal vaccine: Done Tdap vaccine: Done Shingles vaccine: Done   Covid-19: Done  Advanced directives: Please bring a copy of your health care power of attorney and living will to the office to be added to your chart at your convenience.   Conditions/risks identified: Aim for 30 minutes of exercise or brisk walking each day, drink 6-8 glasses of water and eat lots of fruits and vegetables.   Next appointment: Follow up in one year for your annual wellness visit    Preventive Care 65 Years and Older, Female Preventive care refers to lifestyle choices and visits with your health care provider that can promote health and wellness. What does preventive care include? A yearly physical exam. This is also called an annual well check. Dental exams once or twice a year. Routine eye exams. Ask your health care provider how often you should have your eyes checked. Personal lifestyle choices, including: Daily care of your teeth and gums. Regular physical activity. Eating a healthy diet. Avoiding tobacco and drug use. Limiting alcohol use. Practicing safe sex. Taking low-dose aspirin every day. Taking vitamin and mineral supplements as recommended by your health care provider. What happens during an annual  well check? The services and screenings done by your health care provider during your annual well check will depend on your age, overall health, lifestyle risk factors, and family history of disease. Counseling  Your health care provider may ask you questions about your: Alcohol use. Tobacco use. Drug use. Emotional well-being. Home and relationship well-being. Sexual activity. Eating habits. History of falls. Memory and ability to understand (cognition). Work and work Astronomer. Reproductive health. Screening  You may have the following tests or measurements: Height, weight, and BMI. Blood pressure. Lipid and cholesterol levels. These may be checked every 5 years, or more frequently if you are over 67 years old. Skin check. Lung cancer screening. You may have this screening every year starting at age 38 if you have a 30-pack-year history of smoking and currently smoke or have quit within the past 15 years. Fecal occult blood test (FOBT) of the stool. You may have this test every year starting at age 40. Flexible sigmoidoscopy or colonoscopy. You may have a sigmoidoscopy every 5 years or a colonoscopy every 10 years starting at age 31. Hepatitis C blood test. Hepatitis B blood test. Sexually transmitted disease (STD) testing. Diabetes screening. This is done by checking your blood sugar (glucose) after you have not eaten for a while (fasting). You may have this done every 1-3 years. Bone density scan. This is done to screen for osteoporosis. You may have this done starting at age 8. Mammogram. This may be done every 1-2 years. Talk to your health care provider about how often you should have regular mammograms. Talk with your health care provider about your test results, treatment options, and if necessary, the need  for more tests. Vaccines  Your health care provider may recommend certain vaccines, such as: Influenza vaccine. This is recommended every year. Tetanus, diphtheria,  and acellular pertussis (Tdap, Td) vaccine. You may need a Td booster every 10 years. Zoster vaccine. You may need this after age 71. Pneumococcal 13-valent conjugate (PCV13) vaccine. One dose is recommended after age 41. Pneumococcal polysaccharide (PPSV23) vaccine. One dose is recommended after age 56. Talk to your health care provider about which screenings and vaccines you need and how often you need them. This information is not intended to replace advice given to you by your health care provider. Make sure you discuss any questions you have with your health care provider. Document Released: 07/24/2015 Document Revised: 03/16/2016 Document Reviewed: 04/28/2015 Elsevier Interactive Patient Education  2017 [employer].  Fall Prevention in the Home Falls can cause injuries. They can happen to people of all ages. There are many things you can do to make your home safe and to help prevent falls. What can I do on the outside of my home? Regularly fix the edges of walkways and driveways and fix any cracks. Remove anything that might make you trip as you walk through a door, such as a raised step or threshold. Trim any bushes or trees on the path to your home. Use bright outdoor lighting. Clear any walking paths of anything that might make someone trip, such as rocks or tools. Regularly check to see if handrails are loose or broken. Make sure that both sides of any steps have handrails. Any raised decks and porches should have guardrails on the edges. Have any leaves, snow, or ice cleared regularly. Use sand or salt on walking paths during winter. Clean up any spills in your garage right away. This includes oil or grease spills. What can I do in the bathroom? Use night lights. Install grab bars by the toilet and in the tub and shower. Do not use towel bars as grab bars. Use non-skid mats or decals in the tub or shower. If you need to sit down in the shower, use a plastic, non-slip  stool. Keep the floor dry. Clean up any water that spills on the floor as soon as it happens. Remove soap buildup in the tub or shower regularly. Attach bath mats securely with double-sided non-slip rug tape. Do not have throw rugs and other things on the floor that can make you trip. What can I do in the bedroom? Use night lights. Make sure that you have a light by your bed that is easy to reach. Do not use any sheets or blankets that are too big for your bed. They should not hang down onto the floor. Have a firm chair that has side arms. You can use this for support while you get dressed. Do not have throw rugs and other things on the floor that can make you trip. What can I do in the kitchen? Clean up any spills right away. Avoid walking on wet floors. Keep items that you use a lot in easy-to-reach places. If you need to reach something above you, use a strong step stool that has a grab bar. Keep electrical cords out of the way. Do not use floor polish or wax that makes floors slippery. If you must use wax, use non-skid floor wax. Do not have throw rugs and other things on the floor that can make you trip. What can I do with my stairs? Do not leave any items on the stairs.  Make sure that there are handrails on both sides of the stairs and use them. Fix handrails that are broken or loose. Make sure that handrails are as long as the stairways. Check any carpeting to make sure that it is firmly attached to the stairs. Fix any carpet that is loose or worn. Avoid having throw rugs at the top or bottom of the stairs. If you do have throw rugs, attach them to the floor with carpet tape. Make sure that you have a light switch at the top of the stairs and the bottom of the stairs. If you do not have them, ask someone to add them for you. What else can I do to help prevent falls? Wear shoes that: Do not have high heels. Have rubber bottoms. Are comfortable and fit you well. Are closed at the  toe. Do not wear sandals. If you use a stepladder: Make sure that it is fully opened. Do not climb a closed stepladder. Make sure that both sides of the stepladder are locked into place. Ask someone to hold it for you, if possible. Clearly mark and make sure that you can see: Any grab bars or handrails. First and last steps. Where the edge of each step is. Use tools that help you move around (mobility aids) if they are needed. These include: Canes. Walkers. Scooters. Crutches. Turn on the lights when you go into a dark area. Replace any light bulbs as soon as they burn out. Set up your furniture so you have a clear path. Avoid moving your furniture around. If any of your floors are uneven, fix them. If there are any pets around you, be aware of where they are. Review your medicines with your doctor. Some medicines can make you feel dizzy. This can increase your chance of falling. Ask your doctor what other things that you can do to help prevent falls. This information is not intended to replace advice given to you by your health care provider. Make sure you discuss any questions you have with your health care provider. Document Released: 04/23/2009 Document Revised: 12/03/2015 Document Reviewed: 08/01/2014 Elsevier Interactive Patient Education  2017 [employer]

## 2021-10-19 ENCOUNTER — Other Ambulatory Visit: Payer: Self-pay | Admitting: Adult Health

## 2021-10-20 ENCOUNTER — Other Ambulatory Visit: Payer: Self-pay | Admitting: Occupational Therapy

## 2021-10-20 DIAGNOSIS — Z1231 Encounter for screening mammogram for malignant neoplasm of breast: Secondary | ICD-10-CM

## 2021-10-26 ENCOUNTER — Ambulatory Visit
Admission: RE | Admit: 2021-10-26 | Discharge: 2021-10-26 | Disposition: A | Discharge status: Home / Self Care | Payer: Medicare PPO | Source: Ambulatory Visit | Attending: Occupational Therapy | Admitting: Occupational Therapy

## 2021-10-26 DIAGNOSIS — Z1231 Encounter for screening mammogram for malignant neoplasm of breast: Secondary | ICD-10-CM

## 2021-10-27 ENCOUNTER — Other Ambulatory Visit: Payer: Self-pay | Admitting: Adult Health

## 2021-10-27 DIAGNOSIS — Z79899 Other long term (current) drug therapy: Secondary | ICD-10-CM

## 2021-11-11 ENCOUNTER — Encounter: Payer: Self-pay | Admitting: Family Medicine

## 2021-11-11 ENCOUNTER — Ambulatory Visit: Payer: Medicare PPO | Admitting: Family Medicine

## 2021-11-11 DIAGNOSIS — F410 Panic disorder [episodic paroxysmal anxiety] without agoraphobia: Secondary | ICD-10-CM | POA: Diagnosis not present

## 2021-11-11 MED ORDER — ALPRAZOLAM 0.25 MG PO TABS: 0.2500 mg | ORAL_TABLET | Freq: Every day | ORAL | 0 refills | Status: DC | PRN

## 2021-11-11 NOTE — Progress Notes (Signed)
? ?Virtual Visit via Telephone Note ? ?I connected with Julia Harmon on 11/11/21 at 8:49 AM by telephone and verified that I am speaking with the correct person using two identifiers. Julia Harmon is currently located at home and nobody is currently with her during this visit. The provider, Loman Brooklyn, FNP is located in their home at time of visit. ? ?I discussed the limitations, risks, security and privacy concerns of performing an evaluation and management service by telephone and the availability of in person appointments. I also discussed with the patient that there may be a patient responsible charge related to this service. The patient expressed understanding and agreed to proceed. ? ?Subjective: ?PCP: Loman Brooklyn, FNP ? ?Chief Complaint  ?Patient presents with  ? Anxiety  ? ?Patient reports she started having panic attacks a couple of weeks ago.  She describes these as her heart racing, feeling like she is going to pass out, and her legs becoming weak.  She lost her sister-in-law to breast cancer in January and just found out last week her best friend has breast cancer.  She reports having panic attacks 20 years ago at which time she was started on an antidepressant and Xanax, which was then weaned away and she has had no issues since then until now. ? ? ?  11/11/2021  ?  8:52 AM  ?GAD 7 : Generalized Anxiety Score  ?Nervous, Anxious, on Edge 2  ?Control/stop worrying 1  ?Worry too much - different things 0  ?Trouble relaxing 0  ?Restless 0  ?Easily annoyed or irritable 0  ?Afraid - awful might happen 2  ?Total GAD 7 Score 5  ?Anxiety Difficulty Somewhat difficult  ? ? ?ROS: Per HPI ? ?Current Outpatient Medications:  ?  estradiol (ESTRACE) 1 MG tablet, TAKE 1 TABLET BY MOUTH EVERY DAY, Disp: 30 tablet, Rfl: 0 ?  loratadine (CLARITIN) 10 MG tablet, Take 10 mg by mouth daily., Disp: , Rfl:  ?  losartan-hydrochlorothiazide (HYZAAR) 50-12.5 MG tablet, TAKE 1 TABLET BY MOUTH DAILY, Disp: 30 tablet,  Rfl: 6 ? ?No Known Allergies ?Past Medical History:  ?Diagnosis Date  ? Achilles tendinitis   ? Current use of estrogen therapy 10/22/2013  ? Endometriosis   ? HSV-1 (herpes simplex virus 1) infection   ? Hypertension   ? Osteopenia 08/08/2019  ? ? ?Observations/Objective: ?A&O  ?No respiratory distress or wheezing audible over the phone ?Mood, judgement, and thought processes all WNL ? ?Assessment and Plan: ?1. Panic attacks ?Uncontrolled. Started Xanax as needed.  I did not prescribe a long-term medication to treat since this appears to be very situational.  Patient understands if she feels she needs something to take every day she can send me a MyChart message and I will send her something in. ?- ALPRAZolam (XANAX) 0.25 MG tablet; Take 1 tablet (0.25 mg total) by mouth daily as needed for anxiety.  Dispense: 15 tablet; Refill: 0 ? ? ?Follow Up Instructions: ?Return in about 6 weeks (around 12/23/2021) for annual physical. ? ?I discussed the assessment and treatment plan with the patient. The patient was provided an opportunity to ask questions and all were answered. The patient agreed with the plan and demonstrated an understanding of the instructions. ?  ?The patient was advised to call back or seek an in-person evaluation if the symptoms worsen or if the condition fails to improve as anticipated. ? ?The above assessment and management plan was discussed with the patient. The patient verbalized understanding  of and has agreed to the management plan. Patient is aware to call the clinic if symptoms persist or worsen. Patient is aware when to return to the clinic for a follow-up visit. Patient educated on when it is appropriate to go to the emergency department.  ? ?Time call ended: 9:00 AM ? ?I provided 11 minutes of non-face-to-face time during this encounter. ? ?Hendricks Limes, MSN, APRN, FNP-C ?[place] ?11/11/21 ? ? ?

## 2021-11-18 ENCOUNTER — Other Ambulatory Visit: Payer: Self-pay | Admitting: Adult Health

## 2021-11-18 DIAGNOSIS — Z79899 Other long term (current) drug therapy: Secondary | ICD-10-CM

## 2021-11-21 ENCOUNTER — Encounter: Payer: Self-pay | Admitting: Family Medicine

## 2021-11-21 DIAGNOSIS — F410 Panic disorder [episodic paroxysmal anxiety] without agoraphobia: Secondary | ICD-10-CM

## 2021-11-23 MED ORDER — FLUOXETINE HCL 20 MG PO CAPS: 20.0000 mg | ORAL_CAPSULE | Freq: Every day | ORAL | 2 refills | Status: DC

## 2021-11-23 NOTE — Telephone Encounter (Signed)
Patient sent Brayton El a message on 5-14 and she is waiting to hear back from her. Medication is not working and needs to know what to do. ?

## 2021-12-01 ENCOUNTER — Other Ambulatory Visit: Payer: Self-pay | Admitting: Family Medicine

## 2021-12-01 DIAGNOSIS — F410 Panic disorder [episodic paroxysmal anxiety] without agoraphobia: Secondary | ICD-10-CM

## 2021-12-20 ENCOUNTER — Encounter: Payer: Self-pay | Admitting: Family Medicine

## 2021-12-20 NOTE — Telephone Encounter (Signed)
Have patient schedule office or phone  visit with PCP

## 2021-12-24 ENCOUNTER — Telehealth: Payer: Medicare PPO | Admitting: Family Medicine

## 2021-12-24 DIAGNOSIS — F410 Panic disorder [episodic paroxysmal anxiety] without agoraphobia: Secondary | ICD-10-CM | POA: Diagnosis not present

## 2021-12-24 DIAGNOSIS — T887XXA Unspecified adverse effect of drug or medicament, initial encounter: Secondary | ICD-10-CM

## 2021-12-24 MED ORDER — ALPRAZOLAM 0.25 MG PO TABS: 0.2500 mg | ORAL_TABLET | Freq: Every day | ORAL | 0 refills | Status: DC | PRN

## 2021-12-24 NOTE — Progress Notes (Unsigned)
   Virtual Visit via Telephone Note  I connected with Julia Harmon on 12/24/21 at 9:43 AM by telephone and verified that I am speaking with the correct person using two identifiers. Julia Harmon is currently located at home and her husband is currently with her during this visit. The provider, Gwenlyn Fudge, FNP is located in their office at time of visit.  I discussed the limitations, risks, security and privacy concerns of performing an evaluation and management service by telephone and the availability of in person appointments. I also discussed with the patient that there may be a patient responsible charge related to this service. The patient expressed understanding and agreed to proceed.  Subjective: PCP: Gwenlyn Fudge, FNP  No chief complaint on file.  Sick on stomach - better Few headaches - once in a while; relieved with OTC/ASA   ROS: Per HPI  Current Outpatient Medications:    ALPRAZolam (XANAX) 0.25 MG tablet, Take 1 tablet (0.25 mg total) by mouth daily as needed for anxiety., Disp: 15 tablet, Rfl: 0   estradiol (ESTRACE) 1 MG tablet, TAKE 1 TABLET BY MOUTH EVERY DAY, Disp: 30 tablet, Rfl: 2   FLUoxetine (PROZAC) 20 MG capsule, Take 1 capsule (20 mg total) by mouth daily., Disp: 30 capsule, Rfl: 2   loratadine (CLARITIN) 10 MG tablet, Take 10 mg by mouth daily., Disp: , Rfl:    losartan-hydrochlorothiazide (HYZAAR) 50-12.5 MG tablet, TAKE 1 TABLET BY MOUTH DAILY, Disp: 30 tablet, Rfl: 6  No Known Allergies Past Medical History:  Diagnosis Date   Achilles tendinitis    Current use of estrogen therapy 10/22/2013   Endometriosis    HSV-1 (herpes simplex virus 1) infection    Hypertension    Osteopenia 08/08/2019    Observations/Objective: ***  Assessment and Plan: ***  Follow Up Instructions: *** I discussed the assessment and treatment plan with the patient. The patient was provided an opportunity to ask questions and all were answered. The patient  agreed with the plan and demonstrated an understanding of the instructions.   The patient was advised to call back or seek an in-person evaluation if the symptoms worsen or if the condition fails to improve as anticipated.  The above assessment and management plan was discussed with the patient. The patient verbalized understanding of and has agreed to the management plan. Patient is aware to call the clinic if symptoms persist or worsen. Patient is aware when to return to the clinic for a follow-up visit. Patient educated on when it is appropriate to go to the emergency department.   Time call ended: ***  I provided *** minutes of non-face-to-face time during this encounter.  Deliah Boston, MSN, APRN, FNP-C [facility] 12/24/21

## 2021-12-27 ENCOUNTER — Encounter: Payer: Self-pay | Admitting: Family Medicine

## 2021-12-27 DIAGNOSIS — F410 Panic disorder [episodic paroxysmal anxiety] without agoraphobia: Secondary | ICD-10-CM

## 2021-12-27 HISTORY — DX: Panic disorder (episodic paroxysmal anxiety): F41.0

## 2022-01-21 ENCOUNTER — Other Ambulatory Visit: Payer: Self-pay | Admitting: Family Medicine

## 2022-01-21 DIAGNOSIS — F410 Panic disorder [episodic paroxysmal anxiety] without agoraphobia: Secondary | ICD-10-CM

## 2022-01-21 NOTE — Telephone Encounter (Signed)
Ntbs-requesting refill of controlled substance

## 2022-01-21 NOTE — Telephone Encounter (Signed)
Pt made appt for 02/23/2022 for med refilll Pt is aware this medication will not be refilled until she is seen

## 2022-01-26 ENCOUNTER — Encounter: Payer: Self-pay | Admitting: Family Medicine

## 2022-02-08 ENCOUNTER — Other Ambulatory Visit: Payer: Self-pay | Admitting: Family Medicine

## 2022-02-08 ENCOUNTER — Other Ambulatory Visit: Payer: Self-pay | Admitting: Adult Health

## 2022-02-08 DIAGNOSIS — Z79899 Other long term (current) drug therapy: Secondary | ICD-10-CM

## 2022-02-08 DIAGNOSIS — F410 Panic disorder [episodic paroxysmal anxiety] without agoraphobia: Secondary | ICD-10-CM

## 2022-02-23 ENCOUNTER — Ambulatory Visit: Payer: Medicare PPO | Admitting: Family Medicine

## 2022-03-03 ENCOUNTER — Ambulatory Visit: Payer: Medicare PPO | Admitting: Family Medicine

## 2022-03-08 ENCOUNTER — Encounter: Payer: Self-pay | Admitting: Family Medicine

## 2022-03-09 ENCOUNTER — Other Ambulatory Visit: Payer: Self-pay | Admitting: Family Medicine

## 2022-03-09 ENCOUNTER — Ambulatory Visit: Payer: Medicare PPO | Admitting: Family Medicine

## 2022-03-09 DIAGNOSIS — F410 Panic disorder [episodic paroxysmal anxiety] without agoraphobia: Secondary | ICD-10-CM

## 2022-03-10 ENCOUNTER — Encounter: Payer: Self-pay | Admitting: Family Medicine

## 2022-03-10 ENCOUNTER — Telehealth: Payer: Medicare PPO | Admitting: Family Medicine

## 2022-03-10 DIAGNOSIS — F410 Panic disorder [episodic paroxysmal anxiety] without agoraphobia: Secondary | ICD-10-CM | POA: Diagnosis not present

## 2022-03-10 DIAGNOSIS — F411 Generalized anxiety disorder: Secondary | ICD-10-CM

## 2022-03-10 MED ORDER — ALPRAZOLAM 0.25 MG PO TABS: 0.2500 mg | ORAL_TABLET | Freq: Every day | ORAL | 1 refills | Status: DC | PRN

## 2022-03-10 NOTE — Progress Notes (Signed)
Virtual Visit via Video note  I connected with Julia Harmon on 03/10/22 at 2:08 PM by video and verified that I am speaking with the correct person using two identifiers. Julia Harmon is currently located at home and nobody is currently with her during visit. The provider, Gwenlyn Fudge, FNP is located in their office at time of visit.  I discussed the limitations, risks, security and privacy concerns of performing an evaluation and management service by video and the availability of in person appointments. I also discussed with the patient that there may be a patient responsible charge related to this service. The patient expressed understanding and agreed to proceed.  Subjective: PCP: Gwenlyn Fudge, FNP  Chief Complaint  Patient presents with   Anxiety   Patient reports she has anxiety and nausea first thing upon waking from sleep.  She describes the anxiety as feeling panicky with her heart racing.  States she sleeps good all through the night.  Denies snoring, apnea, and daytime sleepiness.  After she gets up and takes her Prozac she starts feeling better and more like herself.  She has only ever taken Prozac in the morning.  She does also have a as needed prescription for Xanax that she takes when she knows she is going to be out in a large crowd.  Her last refill of #15 has lasted almost 3 months.   ROS: Per HPI  Current Outpatient Medications:    ALPRAZolam (XANAX) 0.25 MG tablet, Take 1 tablet (0.25 mg total) by mouth daily as needed for anxiety., Disp: 15 tablet, Rfl: 0   estradiol (ESTRACE) 1 MG tablet, TAKE 1 TABLET BY MOUTH EVERY DAY, Disp: 30 tablet, Rfl: 2   FLUoxetine (PROZAC) 20 MG capsule, TAKE ONE CAPSULE BY MOUTH DAILY, Disp: 30 capsule, Rfl: 0   loratadine (CLARITIN) 10 MG tablet, Take 10 mg by mouth daily., Disp: , Rfl:    losartan-hydrochlorothiazide (HYZAAR) 50-12.5 MG tablet, TAKE 1 TABLET BY MOUTH DAILY, Disp: 30 tablet, Rfl: 6  No Known Allergies Past  Medical History:  Diagnosis Date   Achilles tendinitis    Current use of estrogen therapy 10/22/2013   Endometriosis    HSV-1 (herpes simplex virus 1) infection    Hypertension    Osteopenia 08/08/2019   Panic attacks 12/27/2021    Observations/Objective: Physical Exam Constitutional:      General: She is not in acute distress.    Appearance: Normal appearance. She is not ill-appearing or toxic-appearing.  Eyes:     General: No scleral icterus.       Right eye: No discharge.        Left eye: No discharge.     Conjunctiva/sclera: Conjunctivae normal.  Pulmonary:     Effort: Pulmonary effort is normal. No respiratory distress.  Neurological:     Mental Status: She is alert and oriented to person, place, and time.  Psychiatric:        Mood and Affect: Mood normal.        Behavior: Behavior normal.        Thought Content: Thought content normal.        Judgment: Judgment normal.    Assessment and Plan: 1. Generalized anxiety disorder Encouraged to switch Prozac to bedtime to see if symptoms still occur first thing in the morning upon waking.  If yes, I do not believe this is related to her medication or anxiety.  If the symptoms start occurring just before bedtime, we discussed  she may be rapidly metabolizing the medication is wearing off early.  If this is the case we can make some adjustments.  2. Panic attacks Well controlled on current regimen.  - ALPRAZolam (XANAX) 0.25 MG tablet; Take 1 tablet (0.25 mg total) by mouth daily as needed for anxiety.  Dispense: 15 tablet; Refill: 1    Follow Up Instructions:   I discussed the assessment and treatment plan with the patient. The patient was provided an opportunity to ask questions and all were answered. The patient agreed with the plan and demonstrated an understanding of the instructions.   The patient was advised to call back or seek an in-person evaluation if the symptoms worsen or if the condition fails to improve as  anticipated.  The above assessment and management plan was discussed with the patient. The patient verbalized understanding of and has agreed to the management plan. Patient is aware to call the clinic if symptoms persist or worsen. Patient is aware when to return to the clinic for a follow-up visit. Patient educated on when it is appropriate to go to the emergency department.   Time call ended: 2:20 PM  I provided 12 minutes of face-to-face time during this encounter.   Deliah Boston, MSN, APRN, FNP-C [facility] 03/10/22

## 2022-04-18 ENCOUNTER — Other Ambulatory Visit: Payer: Self-pay | Admitting: Family Medicine

## 2022-04-18 ENCOUNTER — Encounter: Payer: Self-pay | Admitting: Family Medicine

## 2022-04-18 DIAGNOSIS — F410 Panic disorder [episodic paroxysmal anxiety] without agoraphobia: Secondary | ICD-10-CM

## 2022-04-18 NOTE — Telephone Encounter (Signed)
Lmtcb. Letter mailed 

## 2022-05-09 ENCOUNTER — Other Ambulatory Visit: Payer: Self-pay | Admitting: Family Medicine

## 2022-05-09 ENCOUNTER — Other Ambulatory Visit: Payer: Self-pay | Admitting: Adult Health

## 2022-05-09 DIAGNOSIS — F410 Panic disorder [episodic paroxysmal anxiety] without agoraphobia: Secondary | ICD-10-CM

## 2022-06-06 ENCOUNTER — Other Ambulatory Visit: Payer: Self-pay | Admitting: Nurse Practitioner

## 2022-06-06 DIAGNOSIS — F410 Panic disorder [episodic paroxysmal anxiety] without agoraphobia: Secondary | ICD-10-CM

## 2022-06-06 NOTE — Telephone Encounter (Signed)
Julia Harmon pt NTBS by new provider 30 days given 05/09/22

## 2022-06-07 NOTE — Telephone Encounter (Signed)
Apt scheduled 06/14/2022

## 2022-06-08 ENCOUNTER — Telehealth: Payer: Self-pay | Admitting: Adult Health

## 2022-06-08 DIAGNOSIS — Z79899 Other long term (current) drug therapy: Secondary | ICD-10-CM

## 2022-06-08 MED ORDER — ESTRADIOL 1 MG PO TABS: 1.0000 mg | ORAL_TABLET | Freq: Every day | ORAL | 2 refills | Status: DC

## 2022-06-08 NOTE — Telephone Encounter (Signed)
Pt requesting a refill on Estradiol. She has an appt in Jan. Thanks! JSY

## 2022-06-08 NOTE — Telephone Encounter (Signed)
Patient needs refill on estradiol. I have set up for her pap in January. Please advise.

## 2022-06-08 NOTE — Telephone Encounter (Signed)
Refilled estrace appt in January

## 2022-06-08 NOTE — Addendum Note (Signed)
Addended by: Cyril Mourning A on: 06/08/2022 02:32 PM   Modules accepted: Orders

## 2022-06-14 ENCOUNTER — Ambulatory Visit: Payer: Medicare PPO | Admitting: Nurse Practitioner

## 2022-06-14 ENCOUNTER — Encounter: Payer: Self-pay | Admitting: Nurse Practitioner

## 2022-06-14 VITALS — BP 116/74 | HR 100 | Temp 98.6°F | Ht 64.0 in | Wt 155.0 lb

## 2022-06-14 DIAGNOSIS — F339 Major depressive disorder, recurrent, unspecified: Secondary | ICD-10-CM | POA: Diagnosis not present

## 2022-06-14 DIAGNOSIS — F410 Panic disorder [episodic paroxysmal anxiety] without agoraphobia: Secondary | ICD-10-CM | POA: Diagnosis not present

## 2022-06-14 DIAGNOSIS — F411 Generalized anxiety disorder: Secondary | ICD-10-CM | POA: Diagnosis not present

## 2022-06-14 HISTORY — DX: Major depressive disorder, recurrent, unspecified: F33.9

## 2022-06-14 MED ORDER — FLUOXETINE HCL 20 MG PO CAPS: 20.0000 mg | ORAL_CAPSULE | Freq: Every day | ORAL | 2 refills | Status: DC

## 2022-06-14 MED ORDER — ALPRAZOLAM 0.25 MG PO TABS: 0.2500 mg | ORAL_TABLET | Freq: Every day | ORAL | 1 refills | Status: DC | PRN

## 2022-06-14 NOTE — Progress Notes (Signed)
Established Patient Office Visit  Subjective   Patient ID: Julia Harmon, female    DOB: 1955/11/28  Age: 66 y.o. MRN: [id number]  Chief Complaint  Patient presents with   Establish Care    Depression        This is a chronic problem.  The current episode started more than 1 year ago.   The onset quality is gradual.   The problem occurs constantly.  The problem has been gradually improving since onset.  Associated symptoms include decreased concentration, irritable and restlessness.     The symptoms are aggravated by nothing.  Past treatments include SSRIs - Selective serotonin reuptake inhibitors.  Compliance with treatment is good.  Previous treatment provided significant relief.  Past medical history includes anxiety.   Anxiety Presents for follow-up visit. Symptoms include decreased concentration, excessive worry, irritability, nervous/anxious behavior and restlessness. Symptoms occur constantly. The severity of symptoms is mild.      Patient Active Problem List   Diagnosis Date Noted   Generalized anxiety disorder 06/14/2022   Depression, recurrent (HCC) 06/14/2022   Panic attacks 12/27/2021   Osteopenia 08/08/2019   Seasonal allergies 04/29/2019   Osteoarthritis of spine with radiculopathy, cervical region 04/29/2019   Essential hypertension 09/06/2016   Current use of estrogen therapy 10/22/2013   Past Medical History:  Diagnosis Date   Achilles tendinitis    Current use of estrogen therapy 10/22/2013   Depression, recurrent (HCC) 06/14/2022   Endometriosis    HSV-1 (herpes simplex virus 1) infection    Hypertension    Osteopenia 08/08/2019   Panic attacks 12/27/2021   Past Surgical History:  Procedure Laterality Date   ABDOMINAL HYSTERECTOMY     ACHILLES TENDON REPAIR Left 2015   BILATERAL SALPINGOOPHORECTOMY     Social History   Tobacco Use   Smoking status: Former    Years: 10.00    Types: Cigarettes   Smokeless tobacco: Never  Vaping Use   Vaping Use:  Never used  Substance Use Topics   Alcohol use: Yes    Alcohol/week: 2.0 standard drinks of alcohol    Types: 2 Glasses of wine per week    Comment: occ   Drug use: No   Social History   Socioeconomic History   Marital status: Married    Spouse name: Not on file   Number of children: Not on file   Years of education: Not on file   Highest education level: Not on file  Occupational History   Occupation: retired  Tobacco Use   Smoking status: Former    Years: 10.00    Types: Cigarettes   Smokeless tobacco: Never  Vaping Use   Vaping Use: Never used  Substance and Sexual Activity   Alcohol use: Yes    Alcohol/week: 2.0 standard drinks of alcohol    Types: 2 Glasses of wine per week    Comment: occ   Drug use: No   Sexual activity: Yes    Birth control/protection: Surgical    Comment: hyst  Other Topics Concern   Not on file  Social History Narrative   Not on file   Social Determinants of Health   Financial Resource Strain: Low Risk  (08/02/2021)   Overall Financial Resource Strain (CARDIA)    Difficulty of Paying Living Expenses: Not very hard  Food Insecurity: No Food Insecurity (08/02/2021)   Hunger Vital Sign    Worried About Running Out of Food in the Last Year: Never true    Ran  Out of Food in the Last Year: Never true  Transportation Needs: No Transportation Needs (08/02/2021)   PRAPARE - Administrator, Civil Service (Medical): No    Lack of Transportation (Non-Medical): No  Physical Activity: Insufficiently Active (08/02/2021)   Exercise Vital Sign    Days of Exercise per Week: 3 days    Minutes of Exercise per Session: 30 min  Stress: No Stress Concern Present (08/03/2021)   [employer] of Occupational Health - Occupational Stress Questionnaire    Feeling of Stress : Only a little  Social Connections: Unknown (08/02/2021)   Social Connection and Isolation Panel [NHANES]    Frequency of Communication with Friends and Family: More than three  times a week    Frequency of Social Gatherings with Friends and Family: Once a week    Attends Religious Services: Patient refused    Active Member of Clubs or Organizations: No    Attends Banker Meetings: Patient refused    Marital Status: Married  Catering manager Violence: Not At Risk (08/03/2021)   Humiliation, Afraid, Rape, and Kick questionnaire    Fear of Current or Ex-Partner: No    Emotionally Abused: No    Physically Abused: No    Sexually Abused: No   Family Status  Relation Name Status   Mother  Deceased   Father  Deceased   Brother  Deceased   Son adopted Alive   Mat Aunt  Deceased   MGM Hyman Hopes Deceased   MGF  Deceased   PGM  Deceased   PGF  Deceased   Brother  Alive   Family History  Problem Relation Age of Onset   Heart attack Mother    Lung cancer Father    Emphysema Brother    Bladder Cancer Maternal Aunt    Stroke Maternal Grandmother    Heart attack Paternal Grandmother    No Known Allergies    Review of Systems  Constitutional:  Positive for irritability.  Psychiatric/Behavioral:  Positive for decreased concentration and depression. The patient is nervous/anxious.       Objective:     BP 116/74   Pulse 100   Temp 98.6 F (37 C)   Ht 5\' 4"  (1.626 m)   Wt 155 lb (70.3 kg)   SpO2 99%   BMI 26.61 kg/m  BP Readings from Last 3 Encounters:  06/14/22 116/74  04/29/19 117/79  10/16/17 130/82   Wt Readings from Last 3 Encounters:  06/14/22 155 lb (70.3 kg)  08/03/21 161 lb (73 kg)  04/29/19 161 lb (73 kg)      Physical Exam Constitutional:      General: She is irritable.      No results found for any visits on 06/14/22.  Last CBC Lab Results  Component Value Date   WBC 7.1 04/29/2019   HGB 13.6 04/29/2019   HCT 40.7 04/29/2019   MCV 91 04/29/2019   MCH 30.3 04/29/2019   RDW 12.7 04/29/2019   PLT 305 04/29/2019   Last metabolic panel Lab Results  Component Value Date   GLUCOSE 109 (H) 04/29/2019   NA 140  04/29/2019   K 4.1 04/29/2019   CL 101 04/29/2019   CO2 25 04/29/2019   BUN 14 04/29/2019   CREATININE 0.53 (L) 04/29/2019   GFRNONAA 100 04/29/2019   CALCIUM 9.1 04/29/2019   PROT 6.4 04/29/2019   ALBUMIN 4.2 04/29/2019   LABGLOB 2.2 04/29/2019   AGRATIO 1.9 04/29/2019   BILITOT 0.3 04/29/2019  ALKPHOS 100 04/29/2019   AST 15 04/29/2019   ALT 15 04/29/2019      The ASCVD Risk score (Arnett DK, et al., 2019) failed to calculate for the following reasons:   Cannot find a previous HDL lab   Cannot find a previous total cholesterol lab       06/14/2022    2:20 PM 11/11/2021    8:52 AM  GAD 7 : Generalized Anxiety Score  Nervous, Anxious, on Edge 1 2  Control/stop worrying 1 1  Worry too much - different things 1 0  Trouble relaxing 1 0  Restless 1 0  Easily annoyed or irritable  0  Afraid - awful might happen 0 2  Total GAD 7 Score  5  Anxiety Difficulty Somewhat difficult Somewhat difficult     Flowsheet Row Office Visit from 06/14/2022 in [facility]  PHQ-9 Total Score 0       Assessment & Plan:   Problem List Items Addressed This Visit       Other   Panic attacks    Symptoms well-controlled on Xanax 0.5 mg tablet by mouth as needed.  Rx refill sent to pharmacy.      Relevant Medications   ALPRAZolam (XANAX) 0.25 MG tablet   FLUoxetine (PROZAC) 20 MG capsule   Generalized anxiety disorder - Primary    Symptoms are improving and well-controlled on current medication.  Xanax 0.5 mg tablet by mouth as needed.  Prozac 20 mg tablet by mouth daily.      Relevant Medications   ALPRAZolam (XANAX) 0.25 MG tablet   FLUoxetine (PROZAC) 20 MG capsule   Depression, recurrent (HCC)    Completed PHQ-9, GAD-7.  Symptoms are well-controlled on current medication.  Prozac 20 mg tablet by mouth daily.  Follow-up with worsening unresolved symptoms.  Rx refill sent to pharmacy.      Relevant Medications   ALPRAZolam (XANAX) 0.25 MG tablet    FLUoxetine (PROZAC) 20 MG capsule    Return in about 6 months (around 12/14/2022).    Daryll Drown, NP

## 2022-06-14 NOTE — Assessment & Plan Note (Signed)
Completed PHQ-9, GAD-7.  Symptoms are well-controlled on current medication.  Prozac 20 mg tablet by mouth daily.  Follow-up with worsening unresolved symptoms.  Rx refill sent to pharmacy.

## 2022-06-14 NOTE — Patient Instructions (Signed)
Generalized Anxiety Disorder, Adult Generalized anxiety disorder (GAD) is a mental health condition. Unlike normal worries, anxiety related to GAD is not triggered by a specific event. These worries do not fade or get better with time. GAD interferes with relationships, work, and school. GAD symptoms can vary from mild to severe. People with severe GAD can have intense waves of anxiety with physical symptoms that are similar to panic attacks. What are the causes? The exact cause of GAD is not known, but the following are believed to have an impact: Differences in natural brain chemicals. Genes passed down from parents to children. Differences in the way threats are perceived. Development and stress during childhood. Personality. What increases the risk? The following factors may make you more likely to develop this condition: Being female. Having a family history of anxiety disorders. Being very shy. Experiencing very stressful life events, such as the death of a loved one. Having a very stressful family environment. What are the signs or symptoms? People with GAD often worry excessively about many things in their lives, such as their health and family. Symptoms may also include: Mental and emotional symptoms: Worrying excessively about natural disasters. Fear of being late. Difficulty concentrating. Fears that others are judging your performance. Physical symptoms: Fatigue. Headaches, muscle tension, muscle twitches, trembling, or feeling shaky. Feeling like your heart is pounding or beating very fast. Feeling out of breath or like you cannot take a deep breath. Having trouble falling asleep or staying asleep, or experiencing restlessness. Sweating. Nausea, diarrhea, or irritable bowel syndrome (IBS). Behavioral symptoms: Experiencing erratic moods or irritability. Avoidance of new situations. Avoidance of people. Extreme difficulty making decisions. How is this diagnosed? This  condition is diagnosed based on your symptoms and medical history. You will also have a physical exam. Your health care provider may perform tests to rule out other possible causes of your symptoms. To be diagnosed with GAD, a person must have anxiety that: Is out of his or her control. Affects several different aspects of his or her life, such as work and relationships. Causes distress that makes him or her unable to take part in normal activities. Includes at least three symptoms of GAD, such as restlessness, fatigue, trouble concentrating, irritability, muscle tension, or sleep problems. Before your health care provider can confirm a diagnosis of GAD, these symptoms must be present more days than they are not, and they must last for 6 months or longer. How is this treated? This condition may be treated with: Medicine. Antidepressant medicine is usually prescribed for Certain-term daily control. Anti-anxiety medicines may be added in severe cases, especially when panic attacks occur. Talk therapy (psychotherapy). Certain types of talk therapy can be helpful in treating GAD by providing support, education, and guidance. Options include: Cognitive behavioral therapy (CBT). People learn coping skills and self-calming techniques to ease their physical symptoms. They learn to identify unrealistic thoughts and behaviors and to replace them with more appropriate thoughts and behaviors. Acceptance and commitment therapy (ACT). This treatment teaches people how to be mindful as a way to cope with unwanted thoughts and feelings. Biofeedback. This process trains you to manage your body's response (physiological response) through breathing techniques and relaxation methods. You will work with a therapist while machines are used to monitor your physical symptoms. Stress management techniques. These include yoga, meditation, and exercise. A mental health specialist can help determine which treatment is best for you.  Some people see improvement with one type of therapy. However, other people require   a combination of therapies. Follow these instructions at home: Lifestyle Maintain a consistent routine and schedule. Anticipate stressful situations. Create a plan and allow extra time to work with your plan. Practice stress management or self-calming techniques that you have learned from your therapist or your health care provider. Exercise regularly and spend time outdoors. Eat a healthy diet that includes plenty of vegetables, fruits, whole grains, low-fat dairy products, and lean protein. Do not eat a lot of foods that are high in fat, added sugar, or salt (sodium). Drink plenty of water. Avoid alcohol. Alcohol can increase anxiety. Avoid caffeine and certain over-the-counter cold medicines. These may make you feel worse. Ask your pharmacist which medicines to avoid. General instructions Take over-the-counter and prescription medicines only as told by your health care provider. Understand that you are likely to have setbacks. Accept this and be kind to yourself as you persist to take better care of yourself. Anticipate stressful situations. Create a plan and allow extra time to work with your plan. Recognize and accept your accomplishments, even if you judge them as small. Spend time with people who care about you. Keep all follow-up visits. This is important. Where to find more information National Institute of Mental Health: www.nimh.nih.gov Substance Abuse and Mental Health Services: www.samhsa.gov Contact a health care provider if: Your symptoms do not get better. Your symptoms get worse. You have signs of depression, such as: A persistently sad or irritable mood. Loss of enjoyment in activities that used to bring you joy. Change in weight or eating. Changes in sleeping habits. Get help right away if: You have thoughts about hurting yourself or others. If you ever feel like you may hurt  yourself or others, or have thoughts about taking your own life, get help right away. Go to your nearest emergency department or: Call your local emergency services (911 in the [place] Call a suicide crisis helpline, such as the National Suicide Prevention Lifeline at 1-800-273-8255 or 988 in the [place] This is open 24 hours a day in the [place] Text the Crisis Text Line at 741741 (in the [place] Summary Generalized anxiety disorder (GAD) is a mental health condition that involves worry that is not triggered by a specific event. People with GAD often worry excessively about many things in their lives, such as their health and family. GAD may cause symptoms such as restlessness, trouble concentrating, sleep problems, frequent sweating, nausea, diarrhea, headaches, and trembling or muscle twitching. A mental health specialist can help determine which treatment is best for you. Some people see improvement with one type of therapy. However, other people require a combination of therapies. This information is not intended to replace advice given to you by your health care provider. Make sure you discuss any questions you have with your health care provider. Document Revised: 01/20/2021 Document Reviewed: 10/18/2020 Elsevier Patient Education  2023 Elsevier Inc. Major Depressive Disorder, Adult Major depressive disorder (MDD) is a mental health condition. People with this disorder feel very sad, hopeless, and lose interest in things. Symptoms last most of the day, almost every day, for 2 weeks. MDD can affect: Relationships. Work and school. Things you usually like to do. What are the causes? The cause of MDD is not known. What increases the risk? Having family members with depression. Being female. Family problems. Alcohol or drug misuse. A lot of stress in your life, such as from: Living without basic needs such as food and housing. Being treated poorly because of race, sex, or religion  (  discrimination). Things that caused you pain as a child, especially if you lost a parent or were abused. Health and mental problems that you have had for a long time. What are the signs or symptoms? The main symptoms of this condition are: Being sad all the time. Being grouchy (irritable) all the time. Not enjoying the things you usually like. Sleeping too much or too little. Eating too much or too little. Feeling tired. Other symptoms include: Gaining or losing weight, without knowing why. Being restless and weak. Feeling hopeless, worthless, or guilty. Trouble thinking or making decisions. Thoughts of hurting yourself or others, or thoughts of ending your life. Spending a lot of time alone. Being unable to do daily tasks. If you have very bad MDD, you may: Believe things that are not true. Hear, see, taste, or feel things that are not there. Have mild depression that lasts for at least 2 years. Feel very sad and hopeless. Have trouble speaking or moving. Feel very sad during some seasons. How is this treated? Talk therapy. This teaches you about thoughts, feelings, and actions and how to change them. This can also help you to talk with others. This can be done with members of your family. Medicines. Lifestyle changes. You may need to: Limit alcohol use. Stop using drugs, if you use them. Exercise. Get plenty of sleep. Eat healthy. Spend more time outdoors. Brain stimulation. This may be done when symptoms are very bad or have not gotten better. Follow these instructions at home: Alcohol use Do not drink alcohol if: Your health care provider tells you not to drink. You are pregnant, may be pregnant, or are planning to become pregnant. If you drink alcohol: Limit how much you use to: 0-1 drink a day for women. 0-2 drinks a day for men. Know how much alcohol is in your drink. In the [place] one drink equals one 12 oz bottle of beer (355 mL), one 5 oz glass of wine (148  mL), or one 1 oz glass of hard liquor (44 mL). Activity Exercise as told by your doctor. Spend time outdoors. Make time to do the things you enjoy. Find ways to deal with stress. Try to: Meditate. Do deep breathing. Spend time in nature. Keep a journal. Return to your normal activities when your doctor says that it is safe. General instructions  Take over-the-counter and prescription medicines only as told by your doctor. Talk to your doctor about: Alcohol use. It can affect medicines. Any drug use. Eat healthy foods. Get a lot of sleep. Think about joining a support group. Ask your doctor about that. Keep all follow-up visits. Your doctor will need to check on your mood, behavior, and medicines, and change your treatment as needed. Where to find more information: [employer] on Mental Illness: nami[employer] of Mental Health: https://www.frey.org/ American Psychiatric Association: psychiatry.org Contact a doctor if: You feel worse. You get new symptoms. Get help right away if: You hurt yourself on purpose (self-harm). You have thoughts about hurting yourself or others. You see, hear, taste, smell, or feel things that are not there. Get help right away if you feel like you may hurt yourself or others, or have thoughts about taking your own life. Go to your nearest emergency room or: Call 911. Call the [facility] at 915-243-3573 or 988. This is open 24 hours a day. Text the Crisis Text Line at 959-249-5690. This information is not intended to replace advice given to you by your  health care provider. Make sure you discuss any questions you have with your health care provider. Document Revised: 11/02/2021 Document Reviewed: 11/02/2021 Elsevier Patient Education  [place].

## 2022-06-14 NOTE — Assessment & Plan Note (Signed)
Symptoms are improving and well-controlled on current medication.  Xanax 0.5 mg tablet by mouth as needed.  Prozac 20 mg tablet by mouth daily.

## 2022-06-14 NOTE — Assessment & Plan Note (Signed)
Symptoms well-controlled on Xanax 0.5 mg tablet by mouth as needed.  Rx refill sent to pharmacy.

## 2022-07-18 ENCOUNTER — Ambulatory Visit: Payer: Medicare PPO | Admitting: Adult Health

## 2022-07-19 ENCOUNTER — Ambulatory Visit: Payer: Medicare PPO | Admitting: Adult Health

## 2022-07-26 ENCOUNTER — Encounter: Payer: Self-pay | Admitting: *Deleted

## 2022-08-04 ENCOUNTER — Ambulatory Visit (INDEPENDENT_AMBULATORY_CARE_PROVIDER_SITE_OTHER): Payer: Medicare PPO

## 2022-08-04 VITALS — Ht 64.0 in | Wt 155.0 lb

## 2022-08-04 DIAGNOSIS — Z0000 Encounter for general adult medical examination without abnormal findings: Secondary | ICD-10-CM | POA: Diagnosis not present

## 2022-08-04 NOTE — Progress Notes (Signed)
Subjective:   Julia Harmon is a 67 y.o. female who presents for Medicare Annual (Subsequent) preventive examination. I connected with  Julia Harmon on 08/04/22 by a audio enabled telemedicine application and verified that I am speaking with the correct person using two identifiers.  Patient Location: Home  Provider Location: Home Office  I discussed the limitations of evaluation and management by telemedicine. The patient expressed understanding and agreed to proceed.  Review of Systems     Cardiac Risk Factors include: advanced age (>45men, >14 women);hypertension     Objective:    Today's Vitals   08/04/22 1031  Weight: 155 lb (70.3 kg)  Height: 5\' 4"  (1.626 m)   Body mass index is 26.61 kg/m.     08/04/2022   10:34 AM 08/03/2021   11:05 AM 01/17/2017    2:09 PM 09/06/2016    2:09 PM  Advanced Directives  Does Patient Have a Medical Advance Directive? Yes Yes Yes Yes  Type of Paramedic of [place];Living will [place];Living will [place]   Does patient want to make changes to medical advance directive?   No - Patient declined   Copy of [place] in Chart? No - copy requested No - copy requested No - copy requested     Current Medications (verified) Outpatient Encounter Medications as of 08/04/2022  Medication Sig   ALPRAZolam (XANAX) 0.25 MG tablet Take 1 tablet (0.25 mg total) by mouth daily as needed for anxiety.   estradiol (ESTRACE) 1 MG tablet Take 1 tablet (1 mg total) by mouth daily.   FLUoxetine (PROZAC) 20 MG capsule Take 1 capsule (20 mg total) by mouth daily. (NEEDS TO BE SEEN BEFORE NEXT REFILL with new provider in practice)   loratadine (CLARITIN) 10 MG tablet Take 10 mg by mouth daily.   losartan-hydrochlorothiazide (HYZAAR) 50-12.5 MG tablet TAKE 1 TABLET BY MOUTH DAILY   No facility-administered encounter medications on file as of 08/04/2022.    Allergies  (verified) Patient has no known allergies.   History: Past Medical History:  Diagnosis Date   Achilles tendinitis    Current use of estrogen therapy 10/22/2013   Depression, recurrent ([place]) 06/14/2022   Endometriosis    HSV-1 (herpes simplex virus 1) infection    Hypertension    Osteopenia 08/08/2019   Panic attacks 12/27/2021   Past Surgical History:  Procedure Laterality Date   ABDOMINAL HYSTERECTOMY     ACHILLES TENDON REPAIR Left 2015   BILATERAL SALPINGOOPHORECTOMY     Family History  Problem Relation Age of Onset   Heart attack Mother    Lung cancer Father    Emphysema Brother    Bladder Cancer Maternal Aunt    Stroke Maternal Grandmother    Heart attack Paternal Grandmother    Social History   Socioeconomic History   Marital status: Married    Spouse name: Not on file   Number of children: Not on file   Years of education: Not on file   Highest education level: Not on file  Occupational History   Occupation: retired  Tobacco Use   Smoking status: Former    Years: 10.00    Types: Cigarettes   Smokeless tobacco: Never  Vaping Use   Vaping Use: Never used  Substance and Sexual Activity   Alcohol use: Yes    Alcohol/week: 2.0 standard drinks of alcohol    Types: 2 Glasses of wine per week  Comment: occ   Drug use: No   Sexual activity: Yes    Birth control/protection: Surgical    Comment: hyst  Other Topics Concern   Not on file  Social History Narrative   Not on file   Social Determinants of Health   Financial Resource Strain: Low Risk  (08/04/2022)   Overall Financial Resource Strain (CARDIA)    Difficulty of Paying Living Expenses: Not hard at all  Food Insecurity: No Food Insecurity (08/04/2022)   Hunger Vital Sign    Worried About Running Out of Food in the Last Year: Never true    Ran Out of Food in the Last Year: Never true  Transportation Needs: No Transportation Needs (08/04/2022)   PRAPARE - Hydrologist  (Medical): No    Lack of Transportation (Non-Medical): No  Physical Activity: Insufficiently Active (08/04/2022)   Exercise Vital Sign    Days of Exercise per Week: 3 days    Minutes of Exercise per Session: 30 min  Stress: No Stress Concern Present (08/04/2022)   [place]    Feeling of Stress : Not at all  Social Connections: Moderately Integrated (08/04/2022)   Social Connection and Isolation Panel [NHANES]    Frequency of Communication with Friends and Family: More than three times a week    Frequency of Social Gatherings with Friends and Family: More than three times a week    Attends Religious Services: More than 4 times per year    Active Member of [employer] or Organizations: No    Attends Music therapist: Never    Marital Status: Married    Tobacco Counseling Counseling given: Not Answered   Clinical Intake:  Pre-visit preparation completed: Yes  Pain : No/denies pain     Nutritional Risks: None Diabetes: No  How often do you need to have someone help you when you read instructions, pamphlets, or other written materials from your doctor or pharmacy?: 1 - Never  Diabetic?no   Interpreter Needed?: No  Information entered by :: Jadene Pierini, LPN   Activities of Daily Living    08/04/2022   10:34 AM 07/31/2022   11:57 AM  In your present state of health, do you have any difficulty performing the following activities:  Hearing? 0 0  Vision? 0 0  Difficulty concentrating or making decisions? 0 0  Walking or climbing stairs? 0 0  Dressing or bathing? 0 0  Doing errands, shopping? 0 0  Preparing Food and eating ? N N  Using the Toilet? N N  In the past six months, have you accidently leaked urine? N N  Do you have problems with loss of bowel control? N N  Managing your Medications? N N  Managing your Finances? N N  Housekeeping or managing your Housekeeping? N N    Patient Care  Team: Baruch Gouty, FNP as PCP - General (Family Medicine) Estill Dooms, NP as Nurse Practitioner (Obstetrics and Gynecology)  Indicate any recent Medical Services you may have received from other than [facility] providers in the past year (date may be approximate).     Assessment:   This is a routine wellness examination for Julia Harmon.  Hearing/Vision screen Vision Screening - Comments:: Wears rx glasses - up to date with routine eye exams with  Dr.Johnson   Dietary issues and exercise activities discussed: Current Exercise Habits: Home exercise routine, Type of exercise: walking, Time (Minutes): 30, Frequency (Times/Week):  3, Weekly Exercise (Minutes/Week): 90, Intensity: Mild, Exercise limited by: None identified   Goals Addressed             This Visit's Progress    DIET - EAT MORE FRUITS AND VEGETABLES   On track      Depression Screen    08/04/2022   10:33 AM 06/14/2022    2:20 PM 08/03/2021   10:33 AM 04/29/2019   10:58 AM 10/16/2017   11:31 AM 04/25/2017   10:04 AM 09/06/2016    2:15 PM  PHQ 2/9 Scores  PHQ - 2 Score 0 0 1 0 0 0 0  PHQ- 9 Score 0 0  0       Fall Risk    08/04/2022   10:32 AM 07/31/2022   11:57 AM 08/02/2021    7:50 PM 04/29/2019   10:59 AM  Fall Risk   Falls in the past year? 0 0 0 0  Number falls in past yr: 0  0   Injury with Fall? 0  0   Risk for fall due to : No Fall Risks  Orthopedic patient   Follow up Falls prevention discussed  Falls prevention discussed     FALL RISK PREVENTION PERTAINING TO THE HOME:  Any stairs in or around the home? No  If so, are there any without handrails? No  Home free of loose throw rugs in walkways, pet beds, electrical cords, etc? Yes  Adequate lighting in your home to reduce risk of falls? Yes   ASSISTIVE DEVICES UTILIZED TO PREVENT FALLS:  Life alert? No  Use of a cane, walker or w/c? No  Grab bars in the bathroom? No  Shower chair or bench in shower? Yes  Elevated toilet seat or a handicapped  toilet? No        08/03/2021   11:07 AM  6CIT Screen  What Year? 0 points  What month? 0 points  What time? 0 points  Count back from 20 0 points  Months in reverse 0 points  Repeat phrase 0 points  Total Score 0 points    Immunizations Immunization History  Administered Date(s) Administered   Fluad Quad(high Dose 65+) 04/29/2019   Pneumococcal Conjugate-13 04/29/2019   Zoster Recombinat (Shingrix) 12/08/2017    TDAP status: Due, Education has been provided regarding the importance of this vaccine. Advised may receive this vaccine at local pharmacy or Health Dept. Aware to provide a copy of the vaccination record if obtained from local pharmacy or Health Dept. Verbalized acceptance and understanding.  Flu Vaccine status: Up to date  Pneumococcal vaccine status: Up to date  Covid-19 vaccine status: Completed vaccines  Qualifies for Shingles Vaccine? Yes   Zostavax completed No   Shingrix Completed?: No.    Education has been provided regarding the importance of this vaccine. Patient has been advised to call insurance company to determine out of pocket expense if they have not yet received this vaccine. Advised may also receive vaccine at local pharmacy or Health Dept. Verbalized acceptance and understanding.  Screening Tests Health Maintenance  Topic Date Due   COVID-19 Vaccine (1) Never done   DTaP/Tdap/Td (1 - Tdap) Never done   Zoster Vaccines- Shingrix (2 of 2) 02/02/2018   INFLUENZA VACCINE  10/09/2022 (Originally 02/08/2022)   Pneumonia Vaccine 21+ Years old (2 - PPSV23 or PCV20) 06/15/2023 (Originally 04/28/2020)   DEXA SCAN  06/15/2023 (Originally 08/07/2021)   Fecal DNA (Cologuard)  10/07/2022   MAMMOGRAM  10/27/2022   Medicare  Annual Wellness (AWV)  08/05/2023   Hepatitis C Screening  Completed   HPV VACCINES  Aged Out    Health Maintenance  Health Maintenance Due  Topic Date Due   COVID-19 Vaccine (1) Never done   DTaP/Tdap/Td (1 - Tdap) Never done    Zoster Vaccines- Shingrix (2 of 2) 02/02/2018    Colorectal cancer screening: Type of screening: Cologuard. Completed 10/07/2019. Repeat every 3 years  Mammogram status: Completed 10/26/2021. Repeat every year  Bone Density status: Ordered will discuss with PCP . Pt provided with contact info and advised to call to schedule appt.  Lung Cancer Screening: (Low Dose CT Chest recommended if Age 25-80 years, 30 pack-year currently smoking OR have quit w/in 15years.) does not qualify.   Lung Cancer Screening Referral: n/a  Additional Screening:  Hepatitis C Screening: does not qualify; Completed 04/29/2019  Vision Screening: Recommended annual ophthalmology exams for early detection of glaucoma and other disorders of the eye. Is the patient up to date with their annual eye exam?  Yes  Who is the provider or what is the name of the office in which the patient attends annual eye exams? Dr.Johnson  If pt is not established with a provider, would they like to be referred to a provider to establish care? No .   Dental Screening: Recommended annual dental exams for proper oral hygiene  Community Resource Referral / Chronic Care Management: CRR required this visit?  No   CCM required this visit?  No      Plan:     I have personally reviewed and noted the following in the patient's chart:   Medical and social history Use of alcohol, tobacco or illicit drugs  Current medications and supplements including opioid prescriptions. Patient is not currently taking opioid prescriptions. Functional ability and status Nutritional status Physical activity Advanced directives List of other physicians Hospitalizations, surgeries, and ER visits in previous 12 months Vitals Screenings to include cognitive, depression, and falls Referrals and appointments  In addition, I have reviewed and discussed with patient certain preventive protocols, quality metrics, and best practice recommendations. A  written personalized care plan for preventive services as well as general preventive health recommendations were provided to patient.     Lorrene Reid, LPN   07/13/7251   Nurse Notes: Due TDAP , DEXA Scan will discuss with Pcp

## 2022-08-04 NOTE — Patient Instructions (Signed)
Julia Harmon , Thank you for taking time to come for your Medicare Wellness Visit. I appreciate your ongoing commitment to your health goals. Please review the following plan we discussed and let me know if I can assist you in the future.   These are the goals we discussed:  Goals      DIET - EAT MORE FRUITS AND VEGETABLES        This is a list of the screening recommended for you and due dates:  Health Maintenance  Topic Date Due   COVID-19 Vaccine (1) Never done   DTaP/Tdap/Td vaccine (1 - Tdap) Never done   Zoster (Shingles) Vaccine (2 of 2) 02/02/2018   Flu Shot  10/09/2022*   Pneumonia Vaccine (2 - PPSV23 or PCV20) 06/15/2023*   DEXA scan (bone density measurement)  06/15/2023*   Cologuard (Stool DNA test)  10/07/2022   Mammogram  10/27/2022   Medicare Annual Wellness Visit  08/05/2023   Hepatitis C Screening: USPSTF Recommendation to screen - Ages 18-79 yo.  Completed   HPV Vaccine  Aged Out  *Topic was postponed. The date shown is not the original due date.    Advanced directives: Please bring a copy of your health care power of attorney and living will to the office to be added to your chart at your convenience.   Conditions/risks identified: Aim for 30 minutes of exercise or brisk walking, 6-8 glasses of water, and 5 servings of fruits and vegetables each day.   Next appointment: Follow up in one year for your annual wellness visit    Preventive Care 65 Years and Older, Female Preventive care refers to lifestyle choices and visits with your health care provider that can promote health and wellness. What does preventive care include? A yearly physical exam. This is also called an annual well check. Dental exams once or twice a year. Routine eye exams. Ask your health care provider how often you should have your eyes checked. Personal lifestyle choices, including: Daily care of your teeth and gums. Regular physical activity. Eating a healthy diet. Avoiding tobacco and  drug use. Limiting alcohol use. Practicing safe sex. Taking low-dose aspirin every day. Taking vitamin and mineral supplements as recommended by your health care provider. What happens during an annual well check? The services and screenings done by your health care provider during your annual well check will depend on your age, overall health, lifestyle risk factors, and family history of disease. Counseling  Your health care provider may ask you questions about your: Alcohol use. Tobacco use. Drug use. Emotional well-being. Home and relationship well-being. Sexual activity. Eating habits. History of falls. Memory and ability to understand (cognition). Work and work Statistician. Reproductive health. Screening  You may have the following tests or measurements: Height, weight, and BMI. Blood pressure. Lipid and cholesterol levels. These may be checked every 5 years, or more frequently if you are over 3 years old. Skin check. Lung cancer screening. You may have this screening every year starting at age 60 if you have a 30-pack-year history of smoking and currently smoke or have quit within the past 15 years. Fecal occult blood test (FOBT) of the stool. You may have this test every year starting at age 57. Flexible sigmoidoscopy or colonoscopy. You may have a sigmoidoscopy every 5 years or a colonoscopy every 10 years starting at age 67. Hepatitis C blood test. Hepatitis B blood test. Sexually transmitted disease (STD) testing. Diabetes screening. This is done by checking your blood  sugar (glucose) after you have not eaten for a while (fasting). You may have this done every 1-3 years. Bone density scan. This is done to screen for osteoporosis. You may have this done starting at age 53. Mammogram. This may be done every 1-2 years. Talk to your health care provider about how often you should have regular mammograms. Talk with your health care provider about your test results, treatment  options, and if necessary, the need for more tests. Vaccines  Your health care provider may recommend certain vaccines, such as: Influenza vaccine. This is recommended every year. Tetanus, diphtheria, and acellular pertussis (Tdap, Td) vaccine. You may need a Td booster every 10 years. Zoster vaccine. You may need this after age 68. Pneumococcal 13-valent conjugate (PCV13) vaccine. One dose is recommended after age 49. Pneumococcal polysaccharide (PPSV23) vaccine. One dose is recommended after age 9. Talk to your health care provider about which screenings and vaccines you need and how often you need them. This information is not intended to replace advice given to you by your health care provider. Make sure you discuss any questions you have with your health care provider. Document Released: 07/24/2015 Document Revised: 03/16/2016 Document Reviewed: 04/28/2015 Elsevier Interactive Patient Education  2017 [place] Prevention in the Home Falls can cause injuries. They can happen to people of all ages. There are many things you can do to make your home safe and to help prevent falls. What can I do on the outside of my home? Regularly fix the edges of walkways and driveways and fix any cracks. Remove anything that might make you trip as you walk through a door, such as a raised step or threshold. Trim any bushes or trees on the path to your home. Use bright outdoor lighting. Clear any walking paths of anything that might make someone trip, such as rocks or tools. Regularly check to see if handrails are loose or broken. Make sure that both sides of any steps have handrails. Any raised decks and porches should have guardrails on the edges. Have any leaves, snow, or ice cleared regularly. Use sand or salt on walking paths during winter. Clean up any spills in your garage right away. This includes oil or grease spills. What can I do in the bathroom? Use night lights. Install grab  bars by the toilet and in the tub and shower. Do not use towel bars as grab bars. Use non-skid mats or decals in the tub or shower. If you need to sit down in the shower, use a plastic, non-slip stool. Keep the floor dry. Clean up any water that spills on the floor as soon as it happens. Remove soap buildup in the tub or shower regularly. Attach bath mats securely with double-sided non-slip rug tape. Do not have throw rugs and other things on the floor that can make you trip. What can I do in the bedroom? Use night lights. Make sure that you have a light by your bed that is easy to reach. Do not use any sheets or blankets that are too big for your bed. They should not hang down onto the floor. Have a firm chair that has side arms. You can use this for support while you get dressed. Do not have throw rugs and other things on the floor that can make you trip. What can I do in the kitchen? Clean up any spills right away. Avoid walking on wet floors. Keep items that you use a lot in easy-to-reach  places. If you need to reach something above you, use a strong step stool that has a grab bar. Keep electrical cords out of the way. Do not use floor polish or wax that makes floors slippery. If you must use wax, use non-skid floor wax. Do not have throw rugs and other things on the floor that can make you trip. What can I do with my stairs? Do not leave any items on the stairs. Make sure that there are handrails on both sides of the stairs and use them. Fix handrails that are broken or loose. Make sure that handrails are as long as the stairways. Check any carpeting to make sure that it is firmly attached to the stairs. Fix any carpet that is loose or worn. Avoid having throw rugs at the top or bottom of the stairs. If you do have throw rugs, attach them to the floor with carpet tape. Make sure that you have a light switch at the top of the stairs and the bottom of the stairs. If you do not have them,  ask someone to add them for you. What else can I do to help prevent falls? Wear shoes that: Do not have high heels. Have rubber bottoms. Are comfortable and fit you well. Are closed at the toe. Do not wear sandals. If you use a stepladder: Make sure that it is fully opened. Do not climb a closed stepladder. Make sure that both sides of the stepladder are locked into place. Ask someone to hold it for you, if possible. Clearly mark and make sure that you can see: Any grab bars or handrails. First and last steps. Where the edge of each step is. Use tools that help you move around (mobility aids) if they are needed. These include: Canes. Walkers. Scooters. Crutches. Turn on the lights when you go into a dark area. Replace any light bulbs as soon as they burn out. Set up your furniture so you have a clear path. Avoid moving your furniture around. If any of your floors are uneven, fix them. If there are any pets around you, be aware of where they are. Review your medicines with your doctor. Some medicines can make you feel dizzy. This can increase your chance of falling. Ask your doctor what other things that you can do to help prevent falls. This information is not intended to replace advice given to you by your health care provider. Make sure you discuss any questions you have with your health care provider. Document Released: 04/23/2009 Document Revised: 12/03/2015 Document Reviewed: 08/01/2014 Elsevier Interactive Patient Education  2017 [employer].

## 2022-08-18 ENCOUNTER — Other Ambulatory Visit: Payer: Self-pay | Admitting: *Deleted

## 2022-08-18 DIAGNOSIS — F410 Panic disorder [episodic paroxysmal anxiety] without agoraphobia: Secondary | ICD-10-CM

## 2022-08-30 ENCOUNTER — Ambulatory Visit: Payer: Medicare PPO | Admitting: Adult Health

## 2022-08-30 ENCOUNTER — Encounter: Payer: Self-pay | Admitting: Adult Health

## 2022-08-30 VITALS — BP 126/77 | HR 85 | Ht 64.0 in | Wt 163.5 lb

## 2022-08-30 DIAGNOSIS — Z90710 Acquired absence of both cervix and uterus: Secondary | ICD-10-CM | POA: Diagnosis not present

## 2022-08-30 DIAGNOSIS — Z0000 Encounter for general adult medical examination without abnormal findings: Secondary | ICD-10-CM | POA: Diagnosis not present

## 2022-08-30 DIAGNOSIS — Z79899 Other long term (current) drug therapy: Secondary | ICD-10-CM | POA: Diagnosis not present

## 2022-08-30 MED ORDER — ESTRADIOL 0.5 MG PO TABS: 0.5000 mg | ORAL_TABLET | Freq: Every day | ORAL | 3 refills | Status: DC

## 2022-08-30 NOTE — Progress Notes (Signed)
Subjective:     Patient ID: Julia Harmon, female   DOB: 1955/11/21, 67 y.o.   MRN: [id number]  HPI Julia Harmon is a 67 year old white female, married, sp hysterectomy in for breast exam and get refill on estrace.  She says she gets physical at PCP. She says she had panic attack last year after SIL died from breast cancer and started Prozac then.   PCP is Darla Lesches NP  Review of Systems Denies any hot flashes on estrace Did get some when started prozac last year for anxiety.  Reviewed past medical,surgical, social and family history. Reviewed medications and allergies.     Objective:   Physical Exam BP 126/77 (BP Location: Left Arm, Patient Position: Sitting, Cuff Size: Normal)   Pulse 85   Ht 5' 4"$  (1.626 m)   Wt 163 lb 8 oz (74.2 kg)   BMI 28.06 kg/m     Skin warm and dry. Neck: mid line trachea, normal thyroid, good ROM, no lymphadenopathy noted. Lungs: clear to ausculation bilaterally. Cardiovascular: regular rate and rhythm.    Breasts:no dominate palpable mass, retraction or nipple discharge Abdomen is soft and non tender Pelvic was deferred. AA is 2 Fall risk is low    08/30/2022    1:47 PM 08/04/2022   10:33 AM 06/14/2022    2:20 PM  Depression screen PHQ 2/9  Decreased Interest 0 0 0  Down, Depressed, Hopeless 0 0 0  PHQ - 2 Score 0 0 0  Altered sleeping 1 0 0  Tired, decreased energy 0 0 0  Change in appetite 0 0 0  Feeling bad or failure about yourself  0 0 0  Trouble concentrating 0 0 0  Moving slowly or fidgety/restless 0 0 0  Suicidal thoughts 0 0 0  PHQ-9 Score 1 0 0  Difficult doing work/chores Not difficult at all Not difficult at all Not difficult at all       08/30/2022    1:48 PM 06/14/2022    2:20 PM 11/11/2021    8:52 AM  GAD 7 : Generalized Anxiety Score  Nervous, Anxious, on Edge 0 1 2  Control/stop worrying 0 1 1  Worry too much - different things 0 1 0  Trouble relaxing 0 1 0  Restless 0 1 0  Easily annoyed or irritable 0  0  Afraid - awful  might happen 0 0 2  Total GAD 7 Score 0  5  Anxiety Difficulty  Somewhat difficult Somewhat difficult      Upstream - 08/30/22 1346       Pregnancy Intention Screening   Does the patient want to become pregnant in the next year? N/A    Does the patient's partner want to become pregnant in the next year? N/A    Would the patient like to discuss contraceptive options today? N/A      Contraception Wrap Up   Current Method Female Sterilization   hyst   End Method Female Sterilization   hyst   Contraception Counseling Provided No             Assessment:     1. Normal breast exam Normal exam Get mammogram at [facility] in April, had negative one 10/26/21.  2. Current use of estrogen therapy Will start to wean dose down, on 1 mg now will go to 0.5 mg Meds ordered this encounter  Medications   estradiol (ESTRACE) 0.5 MG tablet    Sig: Take 1 tablet (0.5 mg  total) by mouth daily.    Dispense:  30 tablet    Refill:  3    Order Specific Question:   Supervising Provider    Answer:   Tania Ade H [[id number]]   Follow up in 3 months for ROS Can be telehealth visit   3. S/P hysterectomy     Plan:     Telehealth visit in 3 months for ROS  Get physical with PCP

## 2022-09-01 ENCOUNTER — Other Ambulatory Visit: Payer: Self-pay | Admitting: Adult Health

## 2022-09-01 DIAGNOSIS — Z79899 Other long term (current) drug therapy: Secondary | ICD-10-CM

## 2022-09-06 ENCOUNTER — Other Ambulatory Visit: Payer: Self-pay | Admitting: Family Medicine

## 2022-09-06 DIAGNOSIS — F339 Major depressive disorder, recurrent, unspecified: Secondary | ICD-10-CM

## 2022-09-06 DIAGNOSIS — F411 Generalized anxiety disorder: Secondary | ICD-10-CM

## 2022-09-20 ENCOUNTER — Telehealth: Payer: Self-pay | Admitting: Family Medicine

## 2022-09-20 NOTE — Telephone Encounter (Signed)
  Prescription Request  09/20/2022  What is the name of the medication or equipment? XANAX  Have you contacted your pharmacy to request a refill? YES  Which pharmacy would you like this sent to? [facility]  Patient is scheduled to come in to establish care with Donzetta Kohut on 10/20/2022 but is completely out of her Xanax Rx that Je last prescribed to her. Wants to know if refills can be sent in to last her until her appt?  Can lead physician advise on this since Rx is controlled?

## 2022-09-21 NOTE — Telephone Encounter (Signed)
Left detailed message per signed DPR. Encourage patient to call back and schedule an appt. At a sooner date.

## 2022-09-21 NOTE — Telephone Encounter (Signed)
Patient is not a consistent user and gets a refill very infrequently, this medicine always needs to be refilled in the visit so she will have to discuss this in the visit.  If she wants to be seen sooner then they can always discuss being put on a wait list to see Alvie Heidelberg.

## 2022-09-22 NOTE — Telephone Encounter (Signed)
Patient aware. I told her would contact her if there are cancellations

## 2022-10-11 ENCOUNTER — Ambulatory Visit: Payer: Medicare PPO | Admitting: Family Medicine

## 2022-10-11 ENCOUNTER — Encounter: Payer: Self-pay | Admitting: Family Medicine

## 2022-10-11 VITALS — BP 126/83 | HR 85 | Temp 98.7°F | Ht 64.0 in | Wt 166.0 lb

## 2022-10-11 DIAGNOSIS — Z79818 Long term (current) use of other agents affecting estrogen receptors and estrogen levels: Secondary | ICD-10-CM | POA: Diagnosis not present

## 2022-10-11 DIAGNOSIS — I10 Essential (primary) hypertension: Secondary | ICD-10-CM

## 2022-10-11 DIAGNOSIS — F339 Major depressive disorder, recurrent, unspecified: Secondary | ICD-10-CM | POA: Diagnosis not present

## 2022-10-11 DIAGNOSIS — F411 Generalized anxiety disorder: Secondary | ICD-10-CM | POA: Diagnosis not present

## 2022-10-11 DIAGNOSIS — F410 Panic disorder [episodic paroxysmal anxiety] without agoraphobia: Secondary | ICD-10-CM

## 2022-10-11 MED ORDER — ALPRAZOLAM 0.25 MG PO TABS
0.2500 mg | ORAL_TABLET | Freq: Every day | ORAL | 1 refills | Status: DC | PRN
Start: 2022-10-11 — End: 2022-10-11

## 2022-10-11 MED ORDER — HYDROXYZINE PAMOATE 25 MG PO CAPS
25.0000 mg | ORAL_CAPSULE | Freq: Three times a day (TID) | ORAL | 0 refills | Status: DC | PRN
Start: 2022-10-11 — End: 2023-06-20

## 2022-10-11 NOTE — Progress Notes (Signed)
Acute Office Visit  Subjective:  Patient ID: Julia Harmon, female    DOB: [birth year], 67 y.o.   MRN: [id number]  Chief Complaint  Patient presents with   Medical Management of Chronic Issues    Establish care   HPI Patient is in today for follow up on chronic issue  Needs refill of xanax. Takes it as needed and very rarely. States she usually takes a half of a pill when she does take one. Typically on long car rides or when going into a crowd.  Is taking her prozac every other day. States that when she started it last year, it took a long time for her body to adjust, she lost weight and   HTN No complaints. Complaint with medication. Denies changes to vision. Denies lightlessness. Occasional  HA. Patient states she is not concerned with them.   Follows with OBGYN for HRT. Denies complications and side effects.   ROS As per HPI  Objective:  BP 126/83   Pulse 85   Temp 98.7 F (37.1 C)   Ht 5\' 4"  (1.626 m)   Wt 166 lb (75.3 kg)   SpO2 94%   BMI 28.49 kg/m   Physical Exam Constitutional:      General: She is not in acute distress.    Appearance: Normal appearance. She is not ill-appearing, toxic-appearing or diaphoretic.  Cardiovascular:     Rate and Rhythm: Normal rate.     Pulses: Normal pulses.     Heart sounds: Normal heart sounds. No murmur heard.    No gallop.  Pulmonary:     Effort: Pulmonary effort is normal. No respiratory distress.     Breath sounds: Normal breath sounds. No stridor. No wheezing, rhonchi or rales.  Skin:    General: Skin is warm.     Capillary Refill: Capillary refill takes less than 2 seconds.  Neurological:     General: No focal deficit present.     Mental Status: She is alert and oriented to person, place, and time. Mental status is at baseline.     Motor: No weakness.  Psychiatric:        Mood and Affect: Mood normal.        Behavior: Behavior normal.        Thought Content: Thought content normal.        Judgment: Judgment  normal.    Assessment & Plan:  1. Generalized anxiety disorder 2. Panic attacks Medication as below. PDMP review with no red flags. Pt rarely used Xanax that she was previously prescribed and was amenable to trying a new medication that did not have dependence associated with it. Additionally patient did not have active controlled substance agreement in chart or annual toxassure lab work. Patient very understanding to clinic policy and was very willing to complete necessary work to continue controlled substance, however, she was very interested in switching regimens. Will trial medication as below.  - hydrOXYzine (VISTARIL) 25 MG capsule; Take 1 capsule (25 mg total) by mouth every 8 (eight) hours as needed.  Dispense: 30 capsule; Refill: 0  3. Depression, recurrent Well controlled. Continue current regimen. Patient is currently taking prozac every other day. Discussed with patient the long half life of prozac that she could continue to take it every other day. Patient to notify me if she would like to discontinue medication or reduce dose.   4. Primary hypertension Well controlled. Continue current regimen.  Labs as below. Will communicate results to patient once  available.  - CBC with Differential/Platelet; Future - CMP14+EGFR; Future - Lipid panel; Future  5. Long term (current) use of other agents affecting estrogen receptors and estrogen levels Labs as below. Will communicate results to patient once available.  - VITAMIN D 25 Hydroxy (Vit-D Deficiency, Fractures); Future - TSH; Future  Patient not fasting today. States that she will return for labs when fasting.   The above assessment and management plan was discussed with the patient. The patient verbalized understanding of and has agreed to the management plan using shared-decision making. Patient is aware to call the clinic if they develop any new symptoms or if symptoms fail to improve or worsen. Patient is aware when to return to  the clinic for a follow-up visit. Patient educated on when it is appropriate to go to the emergency department.   Donzetta Kohut, DNP-FNP [facility] [address] [place], [place] [address] [phone]

## 2022-10-20 ENCOUNTER — Ambulatory Visit: Payer: Medicare PPO | Admitting: Family Medicine

## 2022-11-30 ENCOUNTER — Other Ambulatory Visit: Payer: Self-pay | Admitting: Adult Health

## 2022-11-30 MED ORDER — ESTRADIOL 0.5 MG PO TABS: 0.5000 mg | ORAL_TABLET | Freq: Every day | ORAL | 9 refills | Status: DC

## 2022-11-30 NOTE — Progress Notes (Signed)
Refill estrace 0.5 mg

## 2022-12-01 ENCOUNTER — Other Ambulatory Visit: Payer: Medicare PPO

## 2022-12-01 ENCOUNTER — Other Ambulatory Visit: Payer: Self-pay | Admitting: Family Medicine

## 2022-12-01 DIAGNOSIS — Z1231 Encounter for screening mammogram for malignant neoplasm of breast: Secondary | ICD-10-CM

## 2022-12-01 DIAGNOSIS — I10 Essential (primary) hypertension: Secondary | ICD-10-CM

## 2022-12-01 DIAGNOSIS — Z79818 Long term (current) use of other agents affecting estrogen receptors and estrogen levels: Secondary | ICD-10-CM | POA: Diagnosis not present

## 2022-12-02 LAB — CMP14+EGFR
ALT: 18 IU/L (ref 0–32)
AST: 18 IU/L (ref 0–40)
Albumin/Globulin Ratio: 1.7 (ref 1.2–2.2)
Albumin: 4.1 g/dL (ref 3.9–4.9)
Alkaline Phosphatase: 104 IU/L (ref 44–121)
BUN/Creatinine Ratio: 35 — ABNORMAL HIGH (ref 12–28)
BUN: 19 mg/dL (ref 8–27)
Bilirubin Total: 0.3 mg/dL (ref 0.0–1.2)
CO2: 22 mmol/L (ref 20–29)
Calcium: 9.3 mg/dL (ref 8.7–10.3)
Chloride: 104 mmol/L (ref 96–106)
Creatinine, Ser: 0.55 mg/dL — ABNORMAL LOW (ref 0.57–1.00)
Globulin, Total: 2.4 g/dL (ref 1.5–4.5)
Glucose: 99 mg/dL (ref 70–99)
Potassium: 4.2 mmol/L (ref 3.5–5.2)
Sodium: 140 mmol/L (ref 134–144)
Total Protein: 6.5 g/dL (ref 6.0–8.5)
eGFR: 100 mL/min/{1.73_m2} (ref >59)

## 2022-12-02 LAB — CBC WITH DIFFERENTIAL/PLATELET
Basophils Absolute: 0 10*3/uL (ref 0.0–0.2)
Basos: 0 %
EOS (ABSOLUTE): 0.3 10*3/uL (ref 0.0–0.4)
Eos: 4 %
Hematocrit: 40 % (ref 34.0–46.6)
Hemoglobin: 13.1 g/dL (ref 11.1–15.9)
Immature Grans (Abs): 0 10*3/uL (ref 0.0–0.1)
Immature Granulocytes: 0 %
Lymphocytes Absolute: 1.3 10*3/uL (ref 0.7–3.1)
Lymphs: 20 %
MCH: 30 pg (ref 26.6–33.0)
MCHC: 32.8 g/dL (ref 31.5–35.7)
MCV: 92 fL (ref 79–97)
Monocytes Absolute: 0.6 10*3/uL (ref 0.1–0.9)
Monocytes: 9 %
Neutrophils Absolute: 4.5 10*3/uL (ref 1.4–7.0)
Neutrophils: 67 %
Platelets: 288 10*3/uL (ref 150–450)
RBC: 4.37 x10E6/uL (ref 3.77–5.28)
RDW: 13 % (ref 11.7–15.4)
WBC: 6.7 10*3/uL (ref 3.4–10.8)

## 2022-12-02 LAB — LIPID PANEL
Chol/HDL Ratio: 3 ratio (ref 0.0–4.4)
Cholesterol, Total: 193 mg/dL (ref 100–199)
HDL: 64 mg/dL (ref >39)
LDL Chol Calc (NIH): 105 mg/dL — ABNORMAL HIGH (ref 0–99)
Triglycerides: 136 mg/dL (ref 0–149)
VLDL Cholesterol Cal: 24 mg/dL (ref 5–40)

## 2022-12-02 LAB — VITAMIN D 25 HYDROXY (VIT D DEFICIENCY, FRACTURES): Vit D, 25-Hydroxy: 22.5 ng/mL — ABNORMAL LOW (ref 30.0–100.0)

## 2022-12-02 LAB — TSH: TSH: 2.16 u[IU]/mL (ref 0.450–4.500)

## 2022-12-06 ENCOUNTER — Other Ambulatory Visit: Payer: Self-pay | Admitting: Adult Health

## 2022-12-06 ENCOUNTER — Encounter: Payer: Self-pay | Admitting: Family Medicine

## 2022-12-06 ENCOUNTER — Ambulatory Visit (INDEPENDENT_AMBULATORY_CARE_PROVIDER_SITE_OTHER): Payer: Medicare PPO | Admitting: Family Medicine

## 2022-12-06 VITALS — BP 122/82 | HR 96 | Ht 64.0 in | Wt 165.0 lb

## 2022-12-06 DIAGNOSIS — F339 Major depressive disorder, recurrent, unspecified: Secondary | ICD-10-CM | POA: Diagnosis not present

## 2022-12-06 DIAGNOSIS — Z0001 Encounter for general adult medical examination with abnormal findings: Secondary | ICD-10-CM | POA: Diagnosis not present

## 2022-12-06 DIAGNOSIS — E7800 Pure hypercholesterolemia, unspecified: Secondary | ICD-10-CM | POA: Diagnosis not present

## 2022-12-06 DIAGNOSIS — M8580 Other specified disorders of bone density and structure, unspecified site: Secondary | ICD-10-CM

## 2022-12-06 DIAGNOSIS — Z0000 Encounter for general adult medical examination without abnormal findings: Secondary | ICD-10-CM

## 2022-12-06 DIAGNOSIS — F411 Generalized anxiety disorder: Secondary | ICD-10-CM

## 2022-12-06 DIAGNOSIS — I10 Essential (primary) hypertension: Secondary | ICD-10-CM | POA: Diagnosis not present

## 2022-12-06 NOTE — Progress Notes (Signed)
Acute Office Visit  Subjective:  Patient ID: Julia Harmon, female    DOB: [birth year], 67 y.o.   MRN: [id number]  Chief Complaint  Patient presents with   Medical Management of Chronic Issues   Anxiety    Patient is in today for follow up of anxiety and depression.  States that things are going well. Reports that she sometimes has trouble sleeping at night. Has "always" struggled with this. States that once she is asleep she is okay. Wonders if she can take hydroxizine at night.   HTN  Blood pressure monitor automatic upper arm  BP at home average 120/82 ROS Denies anxiety, fatigue, peripheral edema, changes to vision, chest pain, headaches, palpitations, sweats, SOB, PND, orthopnea, neck pain,  Meds hyzaar  CAD risks post menopausal   States that she   Occupation: Retired from [employer] account executive  Marital status: female  Diet: well balance, fruits, protein, meats  Exercise: [employer], silver sneakers and online yoga  Substance use: none Last eye exam: 2 years ago  Last dental exam: 1 year  Last colonoscopy: cologuard due  Last mammogram: scheduled  Last pap smear: completed  Contraceptive: completed  Refills needed today: none  Other specialists seen: none  Fasting today:  yes  Immunizations needed: Flu Vaccine: no  Tdap Vaccine: yes  - every 25yrs - (<3 lifetime doses or unknown): all wounds -- look up need for Tetanus IG - (>=3 lifetime doses): clean/minor wound if >33yrs from previous; all other wounds if >88yrs from previous Zoster Vaccine: no (those >50yo, once) Pneumonia Vaccine: no (those w/ risk factors) - (<33yr) Both: Immunocompromised, cochlear implant, CSF leak, asplenic, sickle cell, Chronic Renal Failure - (<5yr) PPSV-23 only: Heart dz, lung disease, DM, tobacco abuse, alcoholism, cirrhosis/liver disease. - (>44yr): PPSV13 then PPSV23 in 6-12mths;  - (>80yr): repeat PPSV23 once if pt received prior to 69yo and 60yrs have passed  ROS As per HPI   Objective:   BP 122/82   Pulse 96   Ht 5\' 4"  (1.626 m)   Wt 165 lb (74.8 kg)   SpO2 93%   BMI 28.32 kg/m   Physical Exam Constitutional:      General: She is awake. She is not in acute distress.    Appearance: Normal appearance. She is well-developed and well-groomed. She is not ill-appearing, toxic-appearing or diaphoretic.  Cardiovascular:     Rate and Rhythm: Normal rate.     Pulses: Normal pulses.          Radial pulses are 2+ on the right side and 2+ on the left side.       Posterior tibial pulses are 2+ on the right side and 2+ on the left side.     Heart sounds: Normal heart sounds. No murmur heard.    No gallop.  Pulmonary:     Effort: Pulmonary effort is normal. No respiratory distress.     Breath sounds: Normal breath sounds. No stridor. No wheezing, rhonchi or rales.  Musculoskeletal:     Cervical back: Full passive range of motion without pain and neck supple.     Right lower leg: No edema.     Left lower leg: No edema.  Skin:    General: Skin is warm.     Capillary Refill: Capillary refill takes less than 2 seconds.  Neurological:     General: No focal deficit present.     Mental Status: She is alert, oriented to person, place, and time and easily aroused. Mental status  is at baseline.     GCS: GCS eye subscore is 4. GCS verbal subscore is 5. GCS motor subscore is 6.     Motor: No weakness.  Psychiatric:        Attention and Perception: Attention and perception normal.        Mood and Affect: Mood and affect normal.        Speech: Speech normal.        Behavior: Behavior normal. Behavior is cooperative.        Thought Content: Thought content normal. Thought content does not include homicidal or suicidal ideation. Thought content does not include homicidal or suicidal plan.        Cognition and Memory: Cognition and memory normal.        Judgment: Judgment normal.       12/06/2022    3:21 PM 10/11/2022    3:24 PM 08/30/2022    1:47 PM  Depression screen PHQ 2/9   Decreased Interest 0 0 0  Down, Depressed, Hopeless 0 0 0  PHQ - 2 Score 0 0 0  Altered sleeping 1 1 1   Tired, decreased energy 0 0 0  Change in appetite 0 0 0  Feeling bad or failure about yourself  0 0 0  Trouble concentrating 0 0 0  Moving slowly or fidgety/restless 0 0 0  Suicidal thoughts 0 0 0  PHQ-9 Score 1 1 1   Difficult doing work/chores Not difficult at all Not difficult at all Not difficult at all      12/06/2022    3:22 PM 10/11/2022    3:24 PM 08/30/2022    1:48 PM 06/14/2022    2:20 PM  GAD 7 : Generalized Anxiety Score  Nervous, Anxious, on Edge 0 1 0 1  Control/stop worrying 0 1 0 1  Worry too much - different things 0 1 0 1  Trouble relaxing 1 1 0 1  Restless 0 0 0 1  Easily annoyed or irritable 0 0 0   Afraid - awful might happen 0 0 0 0  Total GAD 7 Score 1 4 0   Anxiety Difficulty Not difficult at all Not difficult at all  Somewhat difficult    Assessment & Plan:  1. Primary hypertension Well controlled. Continue current regimen.   2. Osteopenia, unspecified location Continue vitamin d and calcium. Dexa scan due.   3. Generalized anxiety disorder 4. Depression, recurrent (HCC) Well controlled on current regimen. Discussed with patient that she can take hydroxyzine at night to help with anxiety while she is trying to fall asleep.   5. Pure hypercholesterolemia Discussed with patient to try red yeast rice. Provided written and verbal instructions. Discussed with patient the risks and benefits. Will recheck in 3 months.   6. Encounter for general adult medical examination without abnormal findings Discussed with patient to continue healthy lifestyle choices, including diet (rich in fruits, vegetables, and lean proteins, and low in salt and simple carbohydrates) and exercise (at least 30 minutes of moderate physical activity daily). Limit beverages high is sugar. Recommended at least 80-100 oz of water daily.  Mammogram scheduled for June  - [facility] DEXA;  Future - Cologuard  The above assessment and management plan was discussed with the patient. The patient verbalized understanding of and has agreed to the management plan using shared-decision making. Patient is aware to call the clinic if they develop any new symptoms or if symptoms fail to improve or worsen. Patient is aware when  to return to the clinic for a follow-up visit. Patient educated on when it is appropriate to go to the emergency department.   Return in about 6 months (around 06/08/2023) for follow up 3 months with fasting labs. Can follow up 6 months for chronic conditions. Earlier if need.  Neale Burly, DNP-FNP [facility] [address] [place], [place] [address] [phone]

## 2022-12-12 ENCOUNTER — Encounter: Payer: Self-pay | Admitting: Family Medicine

## 2022-12-12 ENCOUNTER — Telehealth: Payer: Medicare PPO | Admitting: Family Medicine

## 2022-12-12 DIAGNOSIS — R399 Unspecified symptoms and signs involving the genitourinary system: Secondary | ICD-10-CM

## 2022-12-12 MED ORDER — CEPHALEXIN 500 MG PO CAPS
500.0000 mg | ORAL_CAPSULE | Freq: Four times a day (QID) | ORAL | 0 refills | Status: DC
Start: 2022-12-12 — End: 2023-06-20

## 2022-12-12 NOTE — Progress Notes (Signed)
Virtual Visit via MyChart video note  I connected with Julia Harmon on 12/12/22 at 1336 by video and verified that I am speaking with the correct person using two identifiers. Julia Harmon is currently located at home and patient are currently with her during visit. The provider, Elige Radon Chirstine Defrain, MD is located in their office at time of visit.  Call ended at 1342  I discussed the limitations, risks, security and privacy concerns of performing an evaluation and management service by video and the availability of in person appointments. I also discussed with the patient that there may be a patient responsible charge related to this service. The patient expressed understanding and agreed to proceed.   History and Present Illness: Patient is calling in for urinary dysuria that started 4 days ago.  She is drinking cranberry juice and water and it improved but worsened again last night.   She denies vaginal or stool issues.  She had a little low back pain with it.    1. UTI symptoms     Outpatient Encounter Medications as of 12/12/2022  Medication Sig   cephALEXin (KEFLEX) 500 MG capsule Take 1 capsule (500 mg total) by mouth 4 (four) times daily.   estradiol (ESTRACE) 0.5 MG tablet Take 1 tablet (0.5 mg total) by mouth daily.   FLUoxetine (PROZAC) 20 MG capsule TAKE ONE CAPSULE BY MOUTH EVERY DAY   hydrOXYzine (VISTARIL) 25 MG capsule Take 1 capsule (25 mg total) by mouth every 8 (eight) hours as needed.   loratadine (CLARITIN) 10 MG tablet Take 10 mg by mouth daily.   losartan-hydrochlorothiazide (HYZAAR) 50-12.5 MG tablet TAKE 1 TABLET BY MOUTH DAILY   No facility-administered encounter medications on file as of 12/12/2022.    Review of Systems  Constitutional:  Negative for chills and fever.  Eyes:  Negative for visual disturbance.  Respiratory:  Negative for chest tightness and shortness of breath.   Cardiovascular:  Negative for chest pain and leg swelling.  Gastrointestinal:   Negative for abdominal pain.  Genitourinary:  Positive for dysuria, flank pain, frequency and urgency. Negative for difficulty urinating, hematuria, vaginal bleeding, vaginal discharge and vaginal pain.  Skin:  Negative for rash.  Neurological:  Negative for light-headedness and headaches.  Psychiatric/Behavioral:  Negative for agitation and behavioral problems.   All other systems reviewed and are negative.   Observations/Objective: Patient sounds looks comfortable and in no acute distress  Assessment and Plan: Problem List Items Addressed This Visit   None Visit Diagnoses     UTI symptoms    -  Primary   Relevant Medications   cephALEXin (KEFLEX) 500 MG capsule     Will go ahead and treat based off symptoms with Keflex, if it does not improve or worsens then she will come back for examination and testing  Follow up plan: Return if symptoms worsen or fail to improve.     I discussed the assessment and treatment plan with the patient. The patient was provided an opportunity to ask questions and all were answered. The patient agreed with the plan and demonstrated an understanding of the instructions.   The patient was advised to call back or seek an in-person evaluation if the symptoms worsen or if the condition fails to improve as anticipated.  The above assessment and management plan was discussed with the patient. The patient verbalized understanding of and has agreed to the management plan. Patient is aware to call the clinic if symptoms persist or worsen.  Patient is aware when to return to the clinic for a follow-up visit. Patient educated on when it is appropriate to go to the emergency department.    I provided 6 minutes of non-face-to-face time during this encounter.    Nils Pyle, MD

## 2022-12-14 ENCOUNTER — Ambulatory Visit: Payer: Medicare PPO | Admitting: Nurse Practitioner

## 2022-12-14 ENCOUNTER — Ambulatory Visit: Payer: Medicare PPO | Admitting: Family Medicine

## 2022-12-22 ENCOUNTER — Ambulatory Visit
Admission: RE | Admit: 2022-12-22 | Discharge: 2022-12-22 | Disposition: A | Discharge status: Home / Self Care | Payer: Medicare PPO | Source: Ambulatory Visit | Attending: Family Medicine | Admitting: Family Medicine

## 2022-12-22 DIAGNOSIS — Z1231 Encounter for screening mammogram for malignant neoplasm of breast: Secondary | ICD-10-CM | POA: Diagnosis not present

## 2022-12-26 ENCOUNTER — Telehealth: Payer: Self-pay | Admitting: Family Medicine

## 2022-12-28 NOTE — Telephone Encounter (Signed)
Spoke with rep from [employer], gave CPT code.

## 2023-01-04 ENCOUNTER — Other Ambulatory Visit: Payer: Self-pay | Admitting: Family Medicine

## 2023-01-04 DIAGNOSIS — F339 Major depressive disorder, recurrent, unspecified: Secondary | ICD-10-CM

## 2023-01-04 DIAGNOSIS — F411 Generalized anxiety disorder: Secondary | ICD-10-CM

## 2023-02-08 DIAGNOSIS — Z1212 Encounter for screening for malignant neoplasm of rectum: Secondary | ICD-10-CM | POA: Diagnosis not present

## 2023-02-14 NOTE — Progress Notes (Signed)
Normal cologuard. Follow up in 3 years.

## 2023-05-12 ENCOUNTER — Encounter: Payer: Self-pay | Admitting: *Deleted

## 2023-05-26 ENCOUNTER — Other Ambulatory Visit: Payer: Self-pay | Admitting: Family Medicine

## 2023-05-26 DIAGNOSIS — F339 Major depressive disorder, recurrent, unspecified: Secondary | ICD-10-CM

## 2023-05-26 DIAGNOSIS — F411 Generalized anxiety disorder: Secondary | ICD-10-CM

## 2023-06-19 ENCOUNTER — Other Ambulatory Visit: Payer: Self-pay | Admitting: Family Medicine

## 2023-06-19 ENCOUNTER — Other Ambulatory Visit: Payer: Self-pay | Admitting: Adult Health

## 2023-06-19 DIAGNOSIS — F339 Major depressive disorder, recurrent, unspecified: Secondary | ICD-10-CM

## 2023-06-19 DIAGNOSIS — F411 Generalized anxiety disorder: Secondary | ICD-10-CM

## 2023-06-20 ENCOUNTER — Encounter: Payer: Self-pay | Admitting: Family Medicine

## 2023-06-20 ENCOUNTER — Ambulatory Visit: Payer: Medicare PPO | Admitting: Family Medicine

## 2023-06-20 ENCOUNTER — Other Ambulatory Visit: Payer: Medicare PPO

## 2023-06-20 VITALS — BP 135/84 | HR 93 | Temp 97.4°F | Resp 20 | Ht 64.0 in | Wt 166.5 lb

## 2023-06-20 DIAGNOSIS — E7800 Pure hypercholesterolemia, unspecified: Secondary | ICD-10-CM | POA: Insufficient documentation

## 2023-06-20 DIAGNOSIS — Z23 Encounter for immunization: Secondary | ICD-10-CM

## 2023-06-20 DIAGNOSIS — F411 Generalized anxiety disorder: Secondary | ICD-10-CM

## 2023-06-20 DIAGNOSIS — F339 Major depressive disorder, recurrent, unspecified: Secondary | ICD-10-CM

## 2023-06-20 DIAGNOSIS — I10 Essential (primary) hypertension: Secondary | ICD-10-CM | POA: Diagnosis not present

## 2023-06-20 DIAGNOSIS — M4722 Other spondylosis with radiculopathy, cervical region: Secondary | ICD-10-CM

## 2023-06-20 MED ORDER — FLUOXETINE HCL 20 MG PO CAPS: 20.0000 mg | ORAL_CAPSULE | Freq: Every day | ORAL | 1 refills | Status: DC

## 2023-06-20 MED ORDER — HYDROXYZINE HCL 25 MG PO TABS: 25.0000 mg | ORAL_TABLET | Freq: Three times a day (TID) | ORAL | 0 refills | Status: DC | PRN

## 2023-06-20 NOTE — Progress Notes (Signed)
Subjective:  Patient ID: Julia Harmon, female    DOB: 09/17/1955, 67 y.o.   MRN: [id number]  Patient Care Team: Arrie Senate, FNP as PCP - General (Family Medicine) Adline Potter, NP as Nurse Practitioner (Obstetrics and Gynecology)   Chief Complaint:  Medical Management of Chronic Issues   HPI: Julia Harmon is a 67 y.o. female presenting on 06/20/2023 for Medical Management of Chronic Issues  HPI 1. Primary hypertension  Has BP monitor at home Yes BP at home average good 110s-120s/80.  ROS Denies anxiety, fatigue, peripheral edema, changes to vision, chest pain, headaches, palpitations, sweats, SOB, PND, orthopnea Meds losartan - hydrochlorothiazide CAD risks hypertension, hypercholesterolemia/hyperlipidemia  2. Osteoarthritis of spine with radiculopathy, cervical region States pain is well controlled. Uses [place] gel once in a while. Also uses tens machine for pain management.   3. Depression, recurrent (HCC)/Generalized anxiety disorder Reports that she is doing well overall. Reports drowsiness with hydroxyzine, so she is mainly taking it at night. She used it once during the day for anxiety and did not like the way it made her feels. She reports that she still gets anxious on occasion like if she has to go somewhere or do something new. She avoids taking hydroxyzine due to side effects. Feels that she needs something as needed maybe once per month. States that xanax used to work well for her during the day, but she has not had that in a while and is understanding of long term effects.  Denies SI.   4. Pure hypercholesterolemia Lipid/Cholesterol, Follow-up  Last lipid panel Other pertinent labs  Lab Results  Component Value Date   CHOL 193 12/01/2022   HDL 64 12/01/2022   LDLCALC 105 (H) 12/01/2022   TRIG 136 12/01/2022   CHOLHDL 3.0 12/01/2022   Lab Results  Component Value Date   ALT 18 12/01/2022   AST 18 12/01/2022   PLT 288 12/01/2022   TSH  2.160 12/01/2022     She was last seen for this 6 months ago.  Management since that visit includes RYR   She reports excellent compliance with treatment. She is not having side effects.   Symptoms: No chest pain No chest pressure/discomfort  No dyspnea No lower extremity edema  No numbness or tingling of extremity No orthopnea  No palpitations No paroxysmal nocturnal dyspnea  No speech difficulty No syncope    The 10-year ASCVD risk score (Arnett DK, et al., 2019) is: 12%  ---------------------------------------------------------------------------------------------------   Relevant past medical, surgical, family, and social history reviewed and updated as indicated.  Allergies and medications reviewed and updated. Data reviewed: Chart in Epic.   Past Medical History:  Diagnosis Date   Achilles tendinitis    Current use of estrogen therapy 10/22/2013   Depression, recurrent (HCC) 06/14/2022   Endometriosis    HSV-1 (herpes simplex virus 1) infection    Hypertension    Osteopenia 08/08/2019   Panic attacks 12/27/2021    Past Surgical History:  Procedure Laterality Date   ABDOMINAL HYSTERECTOMY     ACHILLES TENDON REPAIR Left 2015   BILATERAL SALPINGOOPHORECTOMY      Social History   Socioeconomic History   Marital status: Married    Spouse name: Not on file   Number of children: Not on file   Years of education: Not on file   Highest education level: 12th grade  Occupational History   Occupation: retired  Tobacco Use   Smoking status: Former  Types: Cigarettes   Smokeless tobacco: Never  Vaping Use   Vaping status: Never Used  Substance and Sexual Activity   Alcohol use: Yes    Alcohol/week: 2.0 standard drinks of alcohol    Types: 2 Glasses of wine per week    Comment: occ   Drug use: No   Sexual activity: Yes    Birth control/protection: Surgical    Comment: hyst  Other Topics Concern   Not on file  Social History Narrative   Not on file    Social Determinants of Health   Financial Resource Strain: Low Risk  (06/18/2023)   Overall Financial Resource Strain (CARDIA)    Difficulty of Paying Living Expenses: Not hard at all  Food Insecurity: No Food Insecurity (06/18/2023)   Hunger Vital Sign    Worried About Running Out of Food in the Last Year: Never true    Ran Out of Food in the Last Year: Never true  Transportation Needs: No Transportation Needs (06/18/2023)   PRAPARE - Administrator, Civil Service (Medical): No    Lack of Transportation (Non-Medical): No  Physical Activity: Insufficiently Active (06/18/2023)   Exercise Vital Sign    Days of Exercise per Week: 3 days    Minutes of Exercise per Session: 20 min  Stress: No Stress Concern Present (06/18/2023)   [employer] of Occupational Health - Occupational Stress Questionnaire    Feeling of Stress : Only a little  Social Connections: Moderately Integrated (06/18/2023)   Social Connection and Isolation Panel [NHANES]    Frequency of Communication with Friends and Family: More than three times a week    Frequency of Social Gatherings with Friends and Family: Once a week    Attends Religious Services: More than 4 times per year    Active Member of [employer] or Organizations: No    Attends Banker Meetings: Never    Marital Status: Married  Catering manager Violence: Not At Risk (08/30/2022)   Humiliation, Afraid, Rape, and Kick questionnaire    Fear of Current or Ex-Partner: No    Emotionally Abused: No    Physically Abused: No    Sexually Abused: No    Outpatient Encounter Medications as of 06/20/2023  Medication Sig   cholecalciferol (VITAMIN D3) 25 MCG (1000 UNIT) tablet Take 1,000 Units by mouth daily.   estradiol (ESTRACE) 0.5 MG tablet Take 1 tablet (0.5 mg total) by mouth daily.   FLUoxetine (PROZAC) 20 MG capsule Take 1 capsule (20 mg total) by mouth daily. **NEEDS TO BE SEEN BEFORE NEXT REFILL**   hydrOXYzine (VISTARIL) 25 MG  capsule Take 1 capsule (25 mg total) by mouth every 8 (eight) hours as needed.   loratadine (CLARITIN) 10 MG tablet Take 10 mg by mouth daily.   losartan-hydrochlorothiazide (HYZAAR) 50-12.5 MG tablet TAKE 1 TABLET BY MOUTH DAILY   Red Yeast Rice 600 MG CAPS Take by mouth.   [DISCONTINUED] cephALEXin (KEFLEX) 500 MG capsule Take 1 capsule (500 mg total) by mouth 4 (four) times daily.   No facility-administered encounter medications on file as of 06/20/2023.    No Known Allergies  Review of Systems As per HPI  Objective:  BP 135/84   Pulse 93   Temp (!) 97.4 F (36.3 C) (Oral)   Resp 20   Ht 5\' 4"  (1.626 m)   Wt 166 lb 8 oz (75.5 kg)   SpO2 97%   BMI 28.58 kg/m    Wt Readings from Last 3  Encounters:  06/20/23 166 lb 8 oz (75.5 kg)  12/06/22 165 lb (74.8 kg)  10/11/22 166 lb (75.3 kg)   Physical Exam Constitutional:      General: She is awake. She is not in acute distress.    Appearance: Normal appearance. She is well-developed and well-groomed. She is not ill-appearing, toxic-appearing or diaphoretic.  Cardiovascular:     Rate and Rhythm: Normal rate and regular rhythm.     Pulses: Normal pulses.          Radial pulses are 2+ on the right side and 2+ on the left side.       Posterior tibial pulses are 2+ on the right side and 2+ on the left side.     Heart sounds: Normal heart sounds. No murmur heard.    No gallop.  Pulmonary:     Effort: Pulmonary effort is normal. No respiratory distress.     Breath sounds: Normal breath sounds. No stridor. No wheezing, rhonchi or rales.  Musculoskeletal:     Cervical back: Full passive range of motion without pain and neck supple.     Right lower leg: No edema.     Left lower leg: No edema.  Skin:    General: Skin is warm.     Capillary Refill: Capillary refill takes less than 2 seconds.  Neurological:     General: No focal deficit present.     Mental Status: She is alert, oriented to person, place, and time and easily aroused.  Mental status is at baseline.     GCS: GCS eye subscore is 4. GCS verbal subscore is 5. GCS motor subscore is 6.     Motor: No weakness.  Psychiatric:        Attention and Perception: Attention and perception normal.        Mood and Affect: Mood and affect normal.        Speech: Speech normal.        Behavior: Behavior normal. Behavior is cooperative.        Thought Content: Thought content normal. Thought content does not include homicidal or suicidal ideation. Thought content does not include homicidal or suicidal plan.        Cognition and Memory: Cognition and memory normal.        Judgment: Judgment normal.     Results for orders placed or performed in visit on 12/06/22  Cologuard  Result Value Ref Range   COLOGUARD Negative Negative       06/20/2023    2:05 PM 12/06/2022    3:21 PM 10/11/2022    3:24 PM 08/30/2022    1:47 PM 08/04/2022   10:33 AM  Depression screen PHQ 2/9  Decreased Interest 0 0 0 0 0  Down, Depressed, Hopeless 0 0 0 0 0  PHQ - 2 Score 0 0 0 0 0  Altered sleeping 0 1 1 1  0  Tired, decreased energy 0 0 0 0 0  Change in appetite 0 0 0 0 0  Feeling bad or failure about yourself  0 0 0 0 0  Trouble concentrating 0 0 0 0 0  Moving slowly or fidgety/restless 0 0 0 0 0  Suicidal thoughts 0 0 0 0 0  PHQ-9 Score 0 1 1 1  0  Difficult doing work/chores  Not difficult at all Not difficult at all Not difficult at all Not difficult at all       06/20/2023    2:06 PM 12/06/2022  3:22 PM 10/11/2022    3:24 PM 08/30/2022    1:48 PM  GAD 7 : Generalized Anxiety Score  Nervous, Anxious, on Edge 0 0 1 0  Control/stop worrying 0 0 1 0  Worry too much - different things 0 0 1 0  Trouble relaxing 0 1 1 0  Restless 0 0 0 0  Easily annoyed or irritable 0 0 0 0  Afraid - awful might happen 0 0 0 0  Total GAD 7 Score 0 1 4 0  Anxiety Difficulty  Not difficult at all Not difficult at all    Pertinent labs & imaging results that were available during my care of the  patient were reviewed by me and considered in my medical decision making.  Assessment & Plan:  Julia Harmon was seen today for medical management of chronic issues.  Diagnoses and all orders for this visit:  1. Primary hypertension Well controlled per home measurements. Will not make medication adjustments at this time. Labs as below. Will communicate results to patient once available. Will await results to determine next steps.  - CBC with Differential/Platelet; Future - CMP14+EGFR; Future  2. Osteoarthritis of spine with radiculopathy, cervical region Well controlled. Labs as below. Will communicate results to patient once available. Will await results to determine next steps.  - cholecalciferol (VITAMIN D3) 25 MCG (1000 UNIT) tablet; Take 1,000 Units by mouth daily. - VITAMIN D 25 Hydroxy (Vit-D Deficiency, Fractures); Future  3. Depression, recurrent (HCC) Will switch to tablet of hydroxyzine for patient to try half tablet for anxiety during the day. Will continue prozac. Denies SI.  - hydrOXYzine (ATARAX) 25 MG tablet; Take 1 tablet (25 mg total) by mouth 3 (three) times daily as needed.  Dispense: 90 tablet; Refill: 0 - FLUoxetine (PROZAC) 20 MG capsule; Take 1 capsule (20 mg total) by mouth daily.  Dispense: 90 capsule; Refill: 1  4. Generalized anxiety disorder As above.  - hydrOXYzine (ATARAX) 25 MG tablet; Take 1 tablet (25 mg total) by mouth 3 (three) times daily as needed.  Dispense: 90 tablet; Refill: 0 - FLUoxetine (PROZAC) 20 MG capsule; Take 1 capsule (20 mg total) by mouth daily.  Dispense: 90 capsule; Refill: 1  5. Pure hypercholesterolemia Continue current regimen. Labs as below. Will communicate results to patient once available. Will await results to determine next steps.  Plans to come for all labs once fasting  - Red Yeast Rice 600 MG CAPS; Take by mouth. - Lipid panel; Future  6. Encounter for immunization - Pneumococcal conjugate vaccine 20-valent (Prevnar  20)  Continue all other maintenance medications.  Follow up plan: Return in about 6 months (around 12/19/2023) for Chronic Condition Follow up. Patient to follow up sooner if needed to discuss atarax   Continue healthy lifestyle choices, including diet (rich in fruits, vegetables, and lean proteins, and low in salt and simple carbohydrates) and exercise (at least 30 minutes of moderate physical activity daily).  Written and verbal instructions provided   The above assessment and management plan was discussed with the patient. The patient verbalized understanding of and has agreed to the management plan. Patient is aware to call the clinic if they develop any new symptoms or if symptoms persist or worsen. Patient is aware when to return to the clinic for a follow-up visit. Patient educated on when it is appropriate to go to the emergency department.   Neale Burly, DNP-FNP [facility] [address] [place], [place] [address] [phone]

## 2023-07-10 ENCOUNTER — Other Ambulatory Visit: Payer: Medicare PPO

## 2023-07-13 ENCOUNTER — Other Ambulatory Visit: Payer: Medicare PPO

## 2023-07-13 DIAGNOSIS — I10 Essential (primary) hypertension: Secondary | ICD-10-CM

## 2023-07-13 DIAGNOSIS — M4722 Other spondylosis with radiculopathy, cervical region: Secondary | ICD-10-CM | POA: Diagnosis not present

## 2023-07-13 DIAGNOSIS — R7309 Other abnormal glucose: Secondary | ICD-10-CM | POA: Diagnosis not present

## 2023-07-13 DIAGNOSIS — R879 Unspecified abnormal finding in specimens from female genital organs: Secondary | ICD-10-CM | POA: Diagnosis not present

## 2023-07-13 DIAGNOSIS — E7800 Pure hypercholesterolemia, unspecified: Secondary | ICD-10-CM | POA: Diagnosis not present

## 2023-07-14 ENCOUNTER — Encounter: Payer: Self-pay | Admitting: Family Medicine

## 2023-07-14 DIAGNOSIS — R7303 Prediabetes: Secondary | ICD-10-CM | POA: Insufficient documentation

## 2023-07-14 LAB — CBC WITH DIFFERENTIAL/PLATELET
Basophils Absolute: 0 10*3/uL (ref 0.0–0.2)
Basos: 1 %
EOS (ABSOLUTE): 0.3 10*3/uL (ref 0.0–0.4)
Eos: 4 %
Hematocrit: 39.6 % (ref 34.0–46.6)
Hemoglobin: 13.7 g/dL (ref 11.1–15.9)
Immature Grans (Abs): 0 10*3/uL (ref 0.0–0.1)
Immature Granulocytes: 1 %
Lymphocytes Absolute: 1.5 10*3/uL (ref 0.7–3.1)
Lymphs: 23 %
MCH: 31.6 pg (ref 26.6–33.0)
MCHC: 34.6 g/dL (ref 31.5–35.7)
MCV: 91 fL (ref 79–97)
Monocytes Absolute: 0.6 10*3/uL (ref 0.1–0.9)
Monocytes: 9 %
Neutrophils Absolute: 4.1 10*3/uL (ref 1.4–7.0)
Neutrophils: 62 %
Platelets: 315 10*3/uL (ref 150–450)
RBC: 4.34 x10E6/uL (ref 3.77–5.28)
RDW: 13.1 % (ref 11.7–15.4)
WBC: 6.4 10*3/uL (ref 3.4–10.8)

## 2023-07-14 LAB — LIPID PANEL
Chol/HDL Ratio: 2.7 {ratio} (ref 0.0–4.4)
Cholesterol, Total: 205 mg/dL — ABNORMAL HIGH (ref 100–199)
HDL: 75 mg/dL (ref >39)
LDL Chol Calc (NIH): 111 mg/dL — ABNORMAL HIGH (ref 0–99)
Triglycerides: 108 mg/dL (ref 0–149)
VLDL Cholesterol Cal: 19 mg/dL (ref 5–40)

## 2023-07-14 LAB — CMP14+EGFR
ALT: 15 [IU]/L (ref 0–32)
AST: 19 [IU]/L (ref 0–40)
Albumin: 4.1 g/dL (ref 3.9–4.9)
Alkaline Phosphatase: 111 [IU]/L (ref 44–121)
BUN/Creatinine Ratio: 27 (ref 12–28)
BUN: 17 mg/dL (ref 8–27)
Bilirubin Total: 0.4 mg/dL (ref 0.0–1.2)
CO2: 24 mmol/L (ref 20–29)
Calcium: 9.3 mg/dL (ref 8.7–10.3)
Chloride: 102 mmol/L (ref 96–106)
Creatinine, Ser: 0.62 mg/dL (ref 0.57–1.00)
Globulin, Total: 2.3 g/dL (ref 1.5–4.5)
Glucose: 111 mg/dL — ABNORMAL HIGH (ref 70–99)
Potassium: 4.1 mmol/L (ref 3.5–5.2)
Sodium: 141 mmol/L (ref 134–144)
Total Protein: 6.4 g/dL (ref 6.0–8.5)
eGFR: 96 mL/min/{1.73_m2} (ref >59)

## 2023-07-14 LAB — VITAMIN D 25 HYDROXY (VIT D DEFICIENCY, FRACTURES): Vit D, 25-Hydroxy: 31.8 ng/mL (ref 30.0–100.0)

## 2023-07-14 MED ORDER — ROSUVASTATIN CALCIUM 10 MG PO TABS: 10.0000 mg | ORAL_TABLET | Freq: Every day | ORAL | 3 refills | Status: DC

## 2023-07-14 NOTE — Progress Notes (Signed)
 Can we add on A1C?  Cholesterol is elevated. Continue diet and lifestyle modifications. She is taking RYR. Based on her ASCVD risk score, I recommend that she consider switching to statin such as crestor .  The 10-year ASCVD risk score (Arnett DK, et al., 2019) is: 11.8%

## 2023-07-14 NOTE — Progress Notes (Signed)
 Crestor sent to [facility]. Please have patient stop taking RYR.

## 2023-07-17 LAB — SPECIMEN STATUS REPORT

## 2023-07-17 LAB — HGB A1C W/O EAG: Hgb A1c MFr Bld: 5.9 % — ABNORMAL HIGH (ref 4.8–5.6)

## 2023-08-11 ENCOUNTER — Ambulatory Visit (INDEPENDENT_AMBULATORY_CARE_PROVIDER_SITE_OTHER): Payer: Medicare PPO

## 2023-08-11 VITALS — Ht 64.0 in | Wt 166.0 lb

## 2023-08-11 DIAGNOSIS — Z0000 Encounter for general adult medical examination without abnormal findings: Secondary | ICD-10-CM

## 2023-08-11 NOTE — Progress Notes (Signed)
Subjective:   Julia Harmon is a 68 y.o. female who presents for Medicare Annual (Subsequent) preventive examination.  Visit Complete: Virtual I connected with  Julia Harmon on 08/11/23 by a audio enabled telemedicine application and verified that I am speaking with the correct person using two identifiers.  Patient Location: Home  Provider Location: Home Office  This patient declined Interactive audio and video telecommunications. Therefore the visit was completed with audio only.  I discussed the limitations of evaluation and management by telemedicine. The patient expressed understanding and agreed to proceed.  Vital Signs: Because this visit was a virtual/telehealth visit, some criteria may be missing or patient reported. Any vitals not documented were not able to be obtained and vitals that have been documented are patient reported.  Patient Medicare AWV questionnaire was completed by the patient on 08/10/23; I have confirmed that all information answered by patient is correct and no changes since this date.  Cardiac Risk Factors include: advanced age (>35men, >83 women);hypertension     Objective:    Today's Vitals   08/11/23 1502  Weight: 166 lb (75.3 kg)  Height: 5\' 4"  (1.626 m)   Body mass index is 28.49 kg/m.     08/11/2023    3:08 PM 08/04/2022   10:34 AM 08/03/2021   11:05 AM 01/17/2017    2:09 PM 09/06/2016    2:09 PM  Advanced Directives  Does Patient Have a Medical Advance Directive? No Yes Yes Yes Yes  Type of Special educational needs teacher of [place];Living will Healthcare Power of [place];Living will Healthcare Power of Attorney   Does patient want to make changes to medical advance directive?    No - Patient declined   Copy of Healthcare Power of Attorney in Chart?  No - copy requested No - copy requested No - copy requested   Would patient like information on creating a medical advance directive? Yes (MAU/Ambulatory/Procedural Areas - Information  given)        Current Medications (verified) Outpatient Encounter Medications as of 08/11/2023  Medication Sig   cholecalciferol (VITAMIN D3) 25 MCG (1000 UNIT) tablet Take 1,000 Units by mouth daily.   estradiol (ESTRACE) 0.5 MG tablet Take 1 tablet (0.5 mg total) by mouth daily.   FLUoxetine (PROZAC) 20 MG capsule Take 1 capsule (20 mg total) by mouth daily.   hydrOXYzine (ATARAX) 25 MG tablet Take 1 tablet (25 mg total) by mouth 3 (three) times daily as needed.   loratadine (CLARITIN) 10 MG tablet Take 10 mg by mouth daily.   losartan-hydrochlorothiazide (HYZAAR) 50-12.5 MG tablet TAKE 1 TABLET BY MOUTH DAILY   rosuvastatin (CRESTOR) 10 MG tablet Take 1 tablet (10 mg total) by mouth daily.   No facility-administered encounter medications on file as of 08/11/2023.    Allergies (verified) Patient has no known allergies.   History: Past Medical History:  Diagnosis Date   Achilles tendinitis    Current use of estrogen therapy 10/22/2013   Depression, recurrent (HCC) 06/14/2022   Endometriosis    HSV-1 (herpes simplex virus 1) infection    Hypertension    Osteopenia 08/08/2019   Panic attacks 12/27/2021   Past Surgical History:  Procedure Laterality Date   ABDOMINAL HYSTERECTOMY     ACHILLES TENDON REPAIR Left 2015   BILATERAL SALPINGOOPHORECTOMY     Family History  Problem Relation Age of Onset   Heart attack Mother    Lung cancer Father    Emphysema Brother    Bladder Cancer  Maternal Aunt    Stroke Maternal Grandmother    Heart attack Paternal Grandmother    Social History   Socioeconomic History   Marital status: Married    Spouse name: Not on file   Number of children: Not on file   Years of education: Not on file   Highest education level: 12th grade  Occupational History   Occupation: retired  Tobacco Use   Smoking status: Former    Types: Cigarettes   Smokeless tobacco: Never  Vaping Use   Vaping status: Never Used  Substance and Sexual Activity    Alcohol use: Yes    Alcohol/week: 2.0 standard drinks of alcohol    Types: 2 Glasses of wine per week    Comment: occ   Drug use: No   Sexual activity: Yes    Birth control/protection: Surgical    Comment: hyst  Other Topics Concern   Not on file  Social History Narrative   Not on file   Social Drivers of Health   Financial Resource Strain: Low Risk  (08/11/2023)   Overall Financial Resource Strain (CARDIA)    Difficulty of Paying Living Expenses: Not hard at all  Food Insecurity: No Food Insecurity (08/11/2023)   Hunger Vital Sign    Worried About Running Out of Food in the Last Year: Never true    Ran Out of Food in the Last Year: Never true  Transportation Needs: No Transportation Needs (08/11/2023)   PRAPARE - Administrator, Civil Service (Medical): No    Lack of Transportation (Non-Medical): No  Physical Activity: Insufficiently Active (08/11/2023)   Exercise Vital Sign    Days of Exercise per Week: 3 days    Minutes of Exercise per Session: 30 min  Stress: No Stress Concern Present (08/11/2023)   [employer] of Occupational Health - Occupational Stress Questionnaire    Feeling of Stress : Only a little  Social Connections: Moderately Integrated (08/11/2023)   Social Connection and Isolation Panel [NHANES]    Frequency of Communication with Friends and Family: More than three times a week    Frequency of Social Gatherings with Friends and Family: Once a week    Attends Religious Services: More than 4 times per year    Active Member of [employer] or Organizations: No    Attends Engineer, structural: Never    Marital Status: Married    Tobacco Counseling Counseling given: Not Answered   Clinical Intake:  Pre-visit preparation completed: Yes  Pain : No/denies pain     Diabetes: No  How often do you need to have someone help you when you read instructions, pamphlets, or other written materials from your doctor or pharmacy?: 1 -  Never  Interpreter Needed?: No  Information entered by :: Julia Harmon   Activities of Daily Living    08/10/2023    9:35 AM  In your present state of health, do you have any difficulty performing the following activities:  Hearing? 0  Vision? 0  Difficulty concentrating or making decisions? 0  Walking or climbing stairs? 0  Dressing or bathing? 0  Doing errands, shopping? 0  Preparing Food and eating ? N  Using the Toilet? N  In the past six months, have you accidently leaked urine? Y  Do you have problems with loss of bowel control? N  Managing your Medications? N  Managing your Finances? N  Housekeeping or managing your Housekeeping? N    Patient Care Team: Neale Burly  Capps, FNP as PCP - General (Family Medicine) Adline Potter, NP as Nurse Practitioner (Obstetrics and Gynecology)  Indicate any recent Medical Services you may have received from other than [facility] providers in the past year (date may be approximate).     Assessment:   This is a routine wellness examination for Julia Harmon.  Hearing/Vision screen Hearing Screening - Comments:: Denies hearing difficulties   Vision Screening - Comments:: Wears rx glasses - up to date with routine eye exams with [facility]     Goals Addressed             This Visit's Progress    Remain active and independent        Depression Screen    08/11/2023    3:07 PM 06/20/2023    2:05 PM 12/06/2022    3:21 PM 10/11/2022    3:24 PM 08/30/2022    1:47 PM 08/04/2022   10:33 AM 06/14/2022    2:20 PM  PHQ 2/9 Scores  PHQ - 2 Score 0 0 0 0 0 0 0  PHQ- 9 Score 0 0 1 1 1  0 0    Fall Risk    08/11/2023    3:08 PM 08/10/2023    9:35 AM 06/20/2023    2:05 PM 12/06/2022    3:21 PM 10/11/2022    3:25 PM  Fall Risk   Falls in the past year? 0 0 0 0 0  Number falls in past yr: 0    0  Injury with Fall? 0    0  Risk for fall due to : No Fall Risks    No Fall Risks  Follow up Falls prevention discussed;Education  provided;Falls evaluation completed    Falls evaluation completed    MEDICARE RISK AT HOME: Medicare Risk at Home Any stairs in or around the home?: (Patient-Rptd) No If so, are there any without handrails?: (Patient-Rptd) No Home free of loose throw rugs in walkways, pet beds, electrical cords, etc?: (Patient-Rptd) Yes Adequate lighting in your home to reduce risk of falls?: (Patient-Rptd) Yes Life alert?: (Patient-Rptd) No Use of a cane, walker or w/c?: (Patient-Rptd) No Grab bars in the bathroom?: (Patient-Rptd) No Shower chair or bench in shower?: (Patient-Rptd) Yes Elevated toilet seat or a handicapped toilet?: (Patient-Rptd) Yes  TIMED UP AND GO:  Was the test performed?  No    Cognitive Function:        08/11/2023    3:08 PM 08/03/2021   11:07 AM  6CIT Screen  What Year? 0 points 0 points  What month? 0 points 0 points  What time? 0 points 0 points  Count back from 20 0 points 0 points  Months in reverse 0 points 0 points  Repeat phrase 0 points 0 points  Total Score 0 points 0 points    Immunizations Immunization History  Administered Date(s) Administered   Fluad Quad(high Dose 65+) 04/29/2019   Influenza-Unspecified 05/30/2023   Moderna Sars-Covid-2 Vaccination 08/15/2019, 09/13/2019, 05/20/2020   PNEUMOCOCCAL CONJUGATE-20 06/20/2023   Pneumococcal Conjugate-13 04/29/2019   Tdap 04/11/2021   Zoster Recombinant(Shingrix) 12/08/2017, 02/12/2018    TDAP status: Up to date  Flu Vaccine status: Up to date  Pneumococcal vaccine status: Up to date  Covid-19 vaccine status: Information provided on how to obtain vaccines.   Qualifies for Shingles Vaccine? Yes   Zostavax completed No   Shingrix Completed?: Yes  Screening Tests Health Maintenance  Topic Date Due   DEXA SCAN  08/07/2021   COVID-19 Vaccine (  4 - 2024-25 season) 03/12/2023   MAMMOGRAM  12/22/2023   Medicare Annual Wellness (AWV)  08/10/2024   Fecal DNA (Cologuard)  02/07/2026   DTaP/Tdap/Td  (2 - Td or Tdap) 04/12/2031   Pneumonia Vaccine 7+ Years old  Completed   INFLUENZA VACCINE  Completed   Hepatitis C Screening  Completed   Zoster Vaccines- Shingrix  Completed   HPV VACCINES  Aged Out    Health Maintenance  Health Maintenance Due  Topic Date Due   DEXA SCAN  08/07/2021   COVID-19 Vaccine (4 - 2024-25 season) 03/12/2023    Colorectal cancer screening: Type of screening: Cologuard. Completed 02/08/23. Repeat every 3 years  Mammogram status: Completed 12/22/22. Repeat every year  Bone Density status: Ordered and scheduled for 12/19/23. Pt provided with contact info and advised to call to schedule appt.  Lung Cancer Screening: (Low Dose CT Chest recommended if Age 6-80 years, 20 pack-year currently smoking OR have quit w/in 15years.) does not qualify.   Lung Cancer Screening Referral: n/a  Additional Screening:  Hepatitis C Screening: does qualify; Completed 04/29/19  Vision Screening: Recommended annual ophthalmology exams for early detection of glaucoma and other disorders of the eye. Is the patient up to date with their annual eye exam?  Yes  Who is the provider or what is the name of the office in which the patient attends annual eye exams? [facility]  If pt is not established with a provider, would they like to be referred to a provider to establish care? No .   Dental Screening: Recommended annual dental exams for proper oral hygiene  Community Resource Referral / Chronic Care Management: CRR required this visit?  No   CCM required this visit?  No     Plan:     I have personally reviewed and noted the following in the patient's chart:   Medical and social history Use of alcohol, tobacco or illicit drugs  Current medications and supplements including opioid prescriptions. Patient is not currently taking opioid prescriptions. Functional ability and status Nutritional status Physical activity Advanced directives List of other  physicians Hospitalizations, surgeries, and ER visits in previous 12 months Vitals Screenings to include cognitive, depression, and falls Referrals and appointments  In addition, I have reviewed and discussed with patient certain preventive protocols, quality metrics, and best practice recommendations. A written personalized care plan for preventive services as well as general preventive health recommendations were provided to patient.     Julia Fantasia Starr, [place]   1/61/0960   After Visit Summary: (MyChart) Due to this being a telephonic visit, the after visit summary with patients personalized plan was offered to patient via MyChart   Nurse Notes: No concerns at this time

## 2023-08-11 NOTE — Patient Instructions (Signed)
Ms. Mehan , Thank you for taking time to come for your Medicare Wellness Visit. I appreciate your ongoing commitment to your health goals. Please review the following plan we discussed and let me know if I can assist you in the future.   Referrals/Orders/Follow-Ups/Clinician Recommendations: Aim for 30 minutes of exercise or brisk walking, 6-8 glasses of water, and 5 servings of fruits and vegetables each day.  This is a list of the screening recommended for you and due dates:  Health Maintenance  Topic Date Due   DEXA scan (bone density measurement)  08/07/2021   COVID-19 Vaccine (4 - 2024-25 season) 03/12/2023   Mammogram  12/22/2023   Medicare Annual Wellness Visit  08/10/2024   Cologuard (Stool DNA test)  02/07/2026   DTaP/Tdap/Td vaccine (2 - Td or Tdap) 04/12/2031   Pneumonia Vaccine  Completed   Flu Shot  Completed   Hepatitis C Screening  Completed   Zoster (Shingles) Vaccine  Completed   HPV Vaccine  Aged Out    Advanced directives: (ACP Link)Information on Advanced Care Planning can be found at [facility] Advance Health Care Directives Advance Health Care Directives ([email or web address]   Next Medicare Annual Wellness Visit scheduled for next year: Yes

## 2023-10-15 ENCOUNTER — Other Ambulatory Visit: Payer: Self-pay | Admitting: Adult Health

## 2023-12-19 ENCOUNTER — Other Ambulatory Visit: Payer: Medicare PPO

## 2023-12-19 ENCOUNTER — Ambulatory Visit: Payer: Medicare PPO | Admitting: Family Medicine

## 2023-12-28 ENCOUNTER — Ambulatory Visit: Admitting: Nurse Practitioner

## 2024-01-15 ENCOUNTER — Other Ambulatory Visit: Payer: Self-pay | Admitting: Adult Health

## 2024-01-22 ENCOUNTER — Ambulatory Visit: Admitting: Family

## 2024-01-22 ENCOUNTER — Encounter: Payer: Self-pay | Admitting: Family

## 2024-01-22 VITALS — BP 138/84 | HR 85 | Temp 97.4°F | Ht 64.0 in | Wt 166.8 lb

## 2024-01-22 DIAGNOSIS — E7800 Pure hypercholesterolemia, unspecified: Secondary | ICD-10-CM

## 2024-01-22 DIAGNOSIS — M4722 Other spondylosis with radiculopathy, cervical region: Secondary | ICD-10-CM

## 2024-01-22 DIAGNOSIS — F339 Major depressive disorder, recurrent, unspecified: Secondary | ICD-10-CM

## 2024-01-22 DIAGNOSIS — Z79818 Long term (current) use of other agents affecting estrogen receptors and estrogen levels: Secondary | ICD-10-CM | POA: Diagnosis not present

## 2024-01-22 DIAGNOSIS — Z1231 Encounter for screening mammogram for malignant neoplasm of breast: Secondary | ICD-10-CM

## 2024-01-22 DIAGNOSIS — F411 Generalized anxiety disorder: Secondary | ICD-10-CM

## 2024-01-22 DIAGNOSIS — Z0001 Encounter for general adult medical examination with abnormal findings: Secondary | ICD-10-CM | POA: Diagnosis not present

## 2024-01-22 DIAGNOSIS — F410 Panic disorder [episodic paroxysmal anxiety] without agoraphobia: Secondary | ICD-10-CM | POA: Diagnosis not present

## 2024-01-22 DIAGNOSIS — I10 Essential (primary) hypertension: Secondary | ICD-10-CM | POA: Diagnosis not present

## 2024-01-22 DIAGNOSIS — R7303 Prediabetes: Secondary | ICD-10-CM

## 2024-01-22 DIAGNOSIS — M79671 Pain in right foot: Secondary | ICD-10-CM

## 2024-01-22 DIAGNOSIS — Z0000 Encounter for general adult medical examination without abnormal findings: Secondary | ICD-10-CM

## 2024-01-22 LAB — BAYER DCA HB A1C WAIVED: HB A1C (BAYER DCA - WAIVED): 6 % — ABNORMAL HIGH (ref 4.8–5.6)

## 2024-01-22 LAB — LIPID PANEL

## 2024-01-22 MED ORDER — DICLOFENAC SODIUM 75 MG PO TBEC: 75.0000 mg | DELAYED_RELEASE_TABLET | Freq: Two times a day (BID) | ORAL | 2 refills | Status: DC

## 2024-01-22 NOTE — Progress Notes (Signed)
 Subjective:    Patient ID: Julia Harmon, female    DOB: 26-Feb-1956, 68 y.o.   MRN: [id number]  Chief Complaint  Patient presents with   Medical Management of Chronic Issues   PT presents to the office today for CPE and chronic follow up.   She is followed by GYN as needed. She prescribed estrace  0.5 mg daily. She had a hysterectomy at the age of 26.    Has hx of prediabetes.  Hypertension This is a chronic problem. The current episode started more than 1 year ago. The problem has been resolved since onset. The problem is controlled. Associated symptoms include anxiety. Pertinent negatives include no malaise/fatigue, peripheral edema or shortness of breath. Agents associated with hypertension include estrogens. The current treatment provides moderate improvement.  Hyperlipidemia This is a chronic problem. The current episode started more than 1 year ago. The problem is uncontrolled. Recent lipid tests were reviewed and are high. Pertinent negatives include no shortness of breath. Current antihyperlipidemic treatment includes statins. The current treatment provides moderate improvement of lipids. Risk factors for coronary artery disease include dyslipidemia, hypertension and a sedentary lifestyle.  Arthritis Presents for follow-up visit. She complains of pain and stiffness. Affected locations include the left foot and neck.  Depression        This is a chronic problem.  The current episode started more than 1 year ago.   The problem occurs intermittently.  Associated symptoms include restlessness.  Associated symptoms include no helplessness, no hopelessness, not sad and no suicidal ideas.  Past treatments include SSRIs - Selective serotonin reuptake inhibitors.  Past medical history includes anxiety.   Anxiety Presents for follow-up visit. Symptoms include excessive worry, nervous/anxious behavior and restlessness. Patient reports no shortness of breath or suicidal ideas. Symptoms occur  occasionally. The severity of symptoms is mild.        Review of Systems  Constitutional:  Negative for malaise/fatigue.  Respiratory:  Negative for shortness of breath.   Musculoskeletal:  Positive for arthritis and stiffness.  Psychiatric/Behavioral:  Positive for depression. Negative for suicidal ideas. The patient is nervous/anxious.   All other systems reviewed and are negative.   Social History   Socioeconomic History   Marital status: Married    Spouse name: Not on file   Number of children: Not on file   Years of education: Not on file   Highest education level: 12th grade  Occupational History   Occupation: retired  Tobacco Use   Smoking status: Former    Types: Cigarettes   Smokeless tobacco: Never  Vaping Use   Vaping status: Never Used  Substance and Sexual Activity   Alcohol use: Yes    Alcohol/week: 2.0 standard drinks of alcohol    Types: 2 Glasses of wine per week    Comment: occ   Drug use: No   Sexual activity: Yes    Birth control/protection: Surgical    Comment: hyst  Other Topics Concern   Not on file  Social History Narrative   Not on file   Social Drivers of Health   Financial Resource Strain: Low Risk  (01/21/2024)   Overall Financial Resource Strain (CARDIA)    Difficulty of Paying Living Expenses: Not very hard  Food Insecurity: No Food Insecurity (01/21/2024)   Hunger Vital Sign    Worried About Running Out of Food in the Last Year: Never true    Ran Out of Food in the Last Year: Never true  Transportation Needs: No Transportation  Needs (01/21/2024)   PRAPARE - Administrator, Civil Service (Medical): No    Lack of Transportation (Non-Medical): No  Physical Activity: Insufficiently Active (01/21/2024)   Exercise Vital Sign    Days of Exercise per Week: 3 days    Minutes of Exercise per Session: 20 min  Stress: No Stress Concern Present (01/21/2024)   [employer] of Occupational Health - Occupational Stress  Questionnaire    Feeling of Stress: Only a little  Social Connections: Unknown (01/21/2024)   Social Connection and Isolation Panel    Frequency of Communication with Friends and Family: Not on file    Frequency of Social Gatherings with Friends and Family: Once a week    Attends Religious Services: More than 4 times per year    Active Member of [employer] or Organizations: No    Attends Engineer, structural: Not on file    Marital Status: Married   Family History  Problem Relation Age of Onset   Heart attack Mother    Lung cancer Father    Emphysema Brother    Bladder Cancer Maternal Aunt    Stroke Maternal Grandmother    Heart attack Paternal Grandmother         Objective:   Physical Exam Vitals reviewed.  Constitutional:      General: She is not in acute distress.    Appearance: She is well-developed.  HENT:     Head: Normocephalic and atraumatic.     Right Ear: Tympanic membrane normal.     Left Ear: Tympanic membrane normal.  Eyes:     Pupils: Pupils are equal, round, and reactive to light.  Neck:     Thyroid: No thyromegaly.  Cardiovascular:     Rate and Rhythm: Normal rate and regular rhythm.     Heart sounds: Normal heart sounds. No murmur heard. Pulmonary:     Effort: Pulmonary effort is normal. No respiratory distress.     Breath sounds: Normal breath sounds. No wheezing.  Abdominal:     General: Bowel sounds are normal. There is no distension.     Palpations: Abdomen is soft.     Tenderness: There is no abdominal tenderness.  Musculoskeletal:        General: No tenderness. Normal range of motion.     Cervical back: Normal range of motion and neck supple.  Skin:    General: Skin is warm and dry.  Neurological:     Mental Status: She is alert and oriented to person, place, and time.     Cranial Nerves: No cranial nerve deficit.     Deep Tendon Reflexes: Reflexes are normal and symmetric.  Psychiatric:        Behavior: Behavior normal.         Thought Content: Thought content normal.        Judgment: Judgment normal.       BP 138/84   Pulse 85   Temp (!) 97.4 F (36.3 C) (Temporal)   Ht 5' 4 (1.626 m)   Wt 166 lb 12.8 oz (75.7 kg)   SpO2 92%   BMI 28.63 kg/m      Assessment & Plan:  MARLIA SCHEWE comes in today with chief complaint of Medical Management of Chronic Issues   Diagnosis and orders addressed:  1. Annual physical exam (Primary) - MM 3D SCREENING MAMMOGRAM BILATERAL BREAST; Future - CMP14+EGFR - CBC with Differential/Platelet - Lipid panel - Bayer DCA Hb A1c Waived  2. Primary  hypertension - CMP14+EGFR - CBC with Differential/Platelet  3. Current use of estrogen therapy - CMP14+EGFR - CBC with Differential/Platelet  4. Panic attacks - CMP14+EGFR - CBC with Differential/Platelet  5. Generalized anxiety disorder  - CMP14+EGFR - CBC with Differential/Platelet  6. Depression, recurrent (HCC) - CMP14+EGFR - CBC with Differential/Platelet  7. Pure hypercholesterolemia - CMP14+EGFR - CBC with Differential/Platelet - Lipid panel  8. Prediabetes - CMP14+EGFR - CBC with Differential/Platelet - Bayer DCA Hb A1c Waived  9. Osteoarthritis of spine with radiculopathy, cervical region - CMP14+EGFR - CBC with Differential/Platelet - diclofenac  (VOLTAREN ) 75 MG EC tablet; Take 1 tablet (75 mg total) by mouth 2 (two) times daily.  Dispense: 180 tablet; Refill: 2  10. Encounter for screening mammogram for malignant neoplasm of breast - MM 3D SCREENING MAMMOGRAM BILATERAL BREAST; Future - CMP14+EGFR - CBC with Differential/Platelet  11. Right foot pain Start diclofenac  BID with food No other NSAID's Ice as needed - diclofenac  (VOLTAREN ) 75 MG EC tablet; Take 1 tablet (75 mg total) by mouth 2 (two) times daily.  Dispense: 180 tablet; Refill: 2   Labs pending Continue current medications  Keep follow up with specialists  Health Maintenance reviewed Diet and exercise  encouraged  Return in about 6 months (around 07/24/2024), or if symptoms worsen or fail to improve.    Bari Learn, FNP

## 2024-01-22 NOTE — Patient Instructions (Signed)

## 2024-01-23 ENCOUNTER — Ambulatory Visit: Payer: Self-pay | Admitting: Family

## 2024-01-23 LAB — CBC WITH DIFFERENTIAL/PLATELET
Basophils Absolute: 0 x10E3/uL (ref 0.0–0.2)
Basos: 1 %
EOS (ABSOLUTE): 0.2 x10E3/uL (ref 0.0–0.4)
Eos: 2 %
Hematocrit: 42.7 % (ref 34.0–46.6)
Hemoglobin: 14.1 g/dL (ref 11.1–15.9)
Immature Grans (Abs): 0 x10E3/uL (ref 0.0–0.1)
Immature Granulocytes: 0 %
Lymphocytes Absolute: 1.9 x10E3/uL (ref 0.7–3.1)
Lymphs: 26 %
MCH: 30.7 pg (ref 26.6–33.0)
MCHC: 33 g/dL (ref 31.5–35.7)
MCV: 93 fL (ref 79–97)
Monocytes Absolute: 0.6 x10E3/uL (ref 0.1–0.9)
Monocytes: 9 %
Neutrophils Absolute: 4.6 x10E3/uL (ref 1.4–7.0)
Neutrophils: 62 %
Platelets: 306 x10E3/uL (ref 150–450)
RBC: 4.6 x10E6/uL (ref 3.77–5.28)
RDW: 12.8 % (ref 11.7–15.4)
WBC: 7.4 x10E3/uL (ref 3.4–10.8)

## 2024-01-23 LAB — CMP14+EGFR
ALT: 17 IU/L (ref 0–32)
AST: 20 IU/L (ref 0–40)
Albumin: 4.3 g/dL (ref 3.9–4.9)
Alkaline Phosphatase: 113 IU/L (ref 44–121)
BUN/Creatinine Ratio: 21 (ref 12–28)
BUN: 14 mg/dL (ref 8–27)
Bilirubin Total: 0.2 mg/dL (ref 0.0–1.2)
CO2: 22 mmol/L (ref 20–29)
Calcium: 9.3 mg/dL (ref 8.7–10.3)
Chloride: 100 mmol/L (ref 96–106)
Creatinine, Ser: 0.66 mg/dL (ref 0.57–1.00)
Globulin, Total: 2.6 g/dL (ref 1.5–4.5)
Glucose: 110 mg/dL — AB (ref 70–99)
Potassium: 3.8 mmol/L (ref 3.5–5.2)
Sodium: 139 mmol/L (ref 134–144)
Total Protein: 6.9 g/dL (ref 6.0–8.5)
eGFR: 95 mL/min/1.73 (ref >59)

## 2024-01-23 LAB — LIPID PANEL
Cholesterol, Total: 153 mg/dL (ref 100–199)
HDL: 72 mg/dL (ref >39)
LDL CALC COMMENT:: 2.1 ratio (ref 0.0–4.4)
LDL Chol Calc (NIH): 55 mg/dL (ref 0–99)
Triglycerides: 156 mg/dL — AB (ref 0–149)
VLDL Cholesterol Cal: 26 mg/dL (ref 5–40)

## 2024-01-25 ENCOUNTER — Other Ambulatory Visit: Payer: Self-pay | Admitting: *Deleted

## 2024-01-25 DIAGNOSIS — F411 Generalized anxiety disorder: Secondary | ICD-10-CM

## 2024-01-25 DIAGNOSIS — F339 Major depressive disorder, recurrent, unspecified: Secondary | ICD-10-CM

## 2024-01-25 MED ORDER — FLUOXETINE HCL 20 MG PO CAPS: 20.0000 mg | ORAL_CAPSULE | Freq: Every day | ORAL | 1 refills | Status: DC

## 2024-01-29 ENCOUNTER — Ambulatory Visit
Admission: RE | Admit: 2024-01-29 | Discharge: 2024-01-29 | Disposition: A | Discharge status: Home / Self Care | Source: Ambulatory Visit | Attending: Family | Admitting: Family

## 2024-01-29 DIAGNOSIS — Z1231 Encounter for screening mammogram for malignant neoplasm of breast: Secondary | ICD-10-CM

## 2024-01-29 DIAGNOSIS — Z0000 Encounter for general adult medical examination without abnormal findings: Secondary | ICD-10-CM

## 2024-03-23 IMAGING — MG MM DIGITAL SCREENING BILAT W/ TOMO AND CAD
8 series · 9 of 24 positions shown · non-contrast
Comparison: Previous exam(s).

CLINICAL DATA: Screening.

EXAM:
DIGITAL SCREENING BILATERAL MAMMOGRAM WITH TOMOSYNTHESIS AND CAD
TECHNIQUE: Bilateral screening digital craniocaudal and mediolateral oblique
mammograms were obtained. Bilateral screening digital breast
tomosynthesis was performed. The images were evaluated with
computer-aided detection.

[R CC synth-2D]
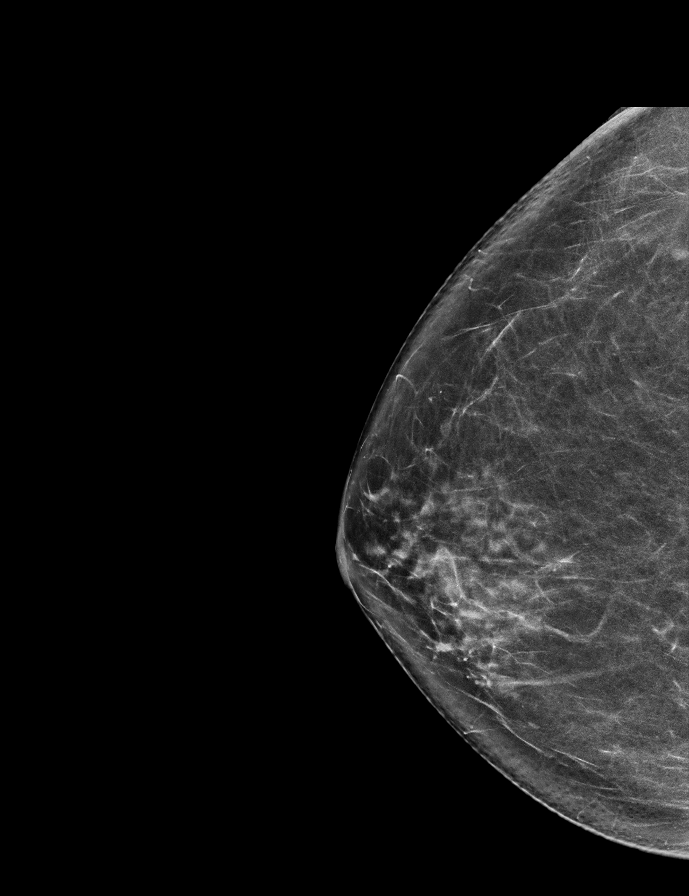

[L MLO synth-2D]
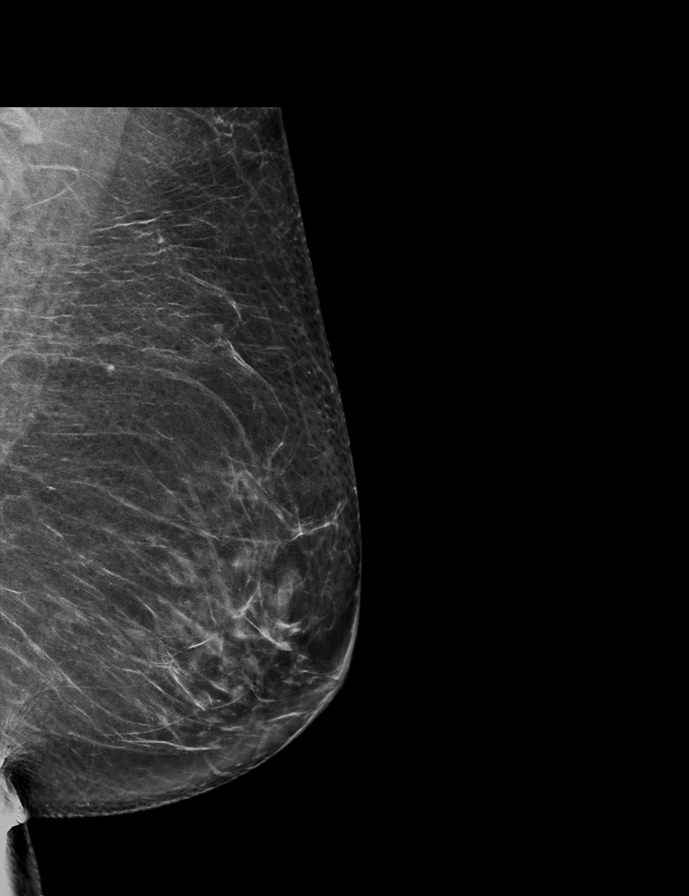

[R MLO synth-2D]
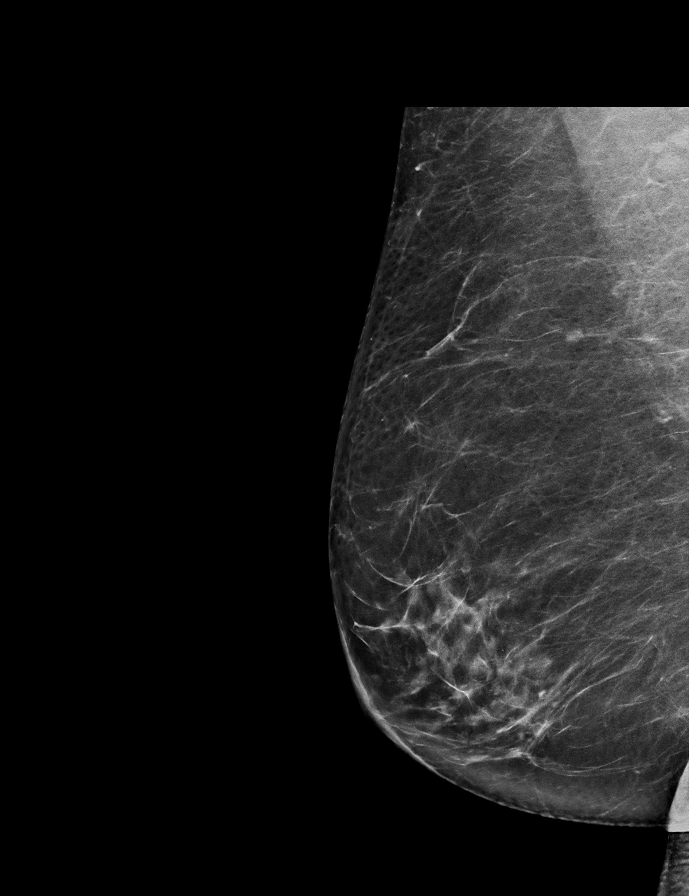

[L CC synth-2D]
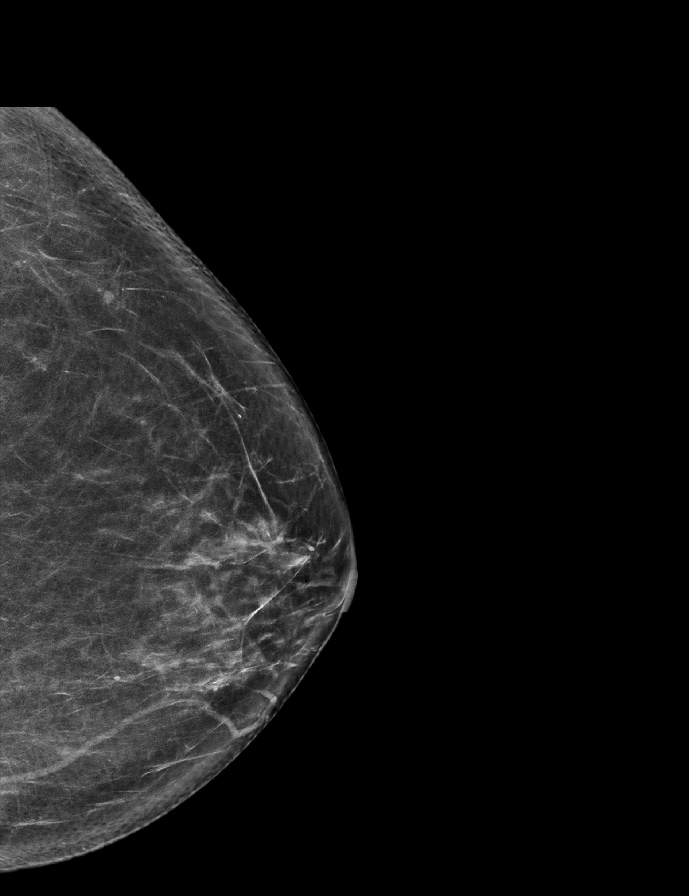

[R CC tomo · 2 of 69 frames shown]
[frame 23/69]
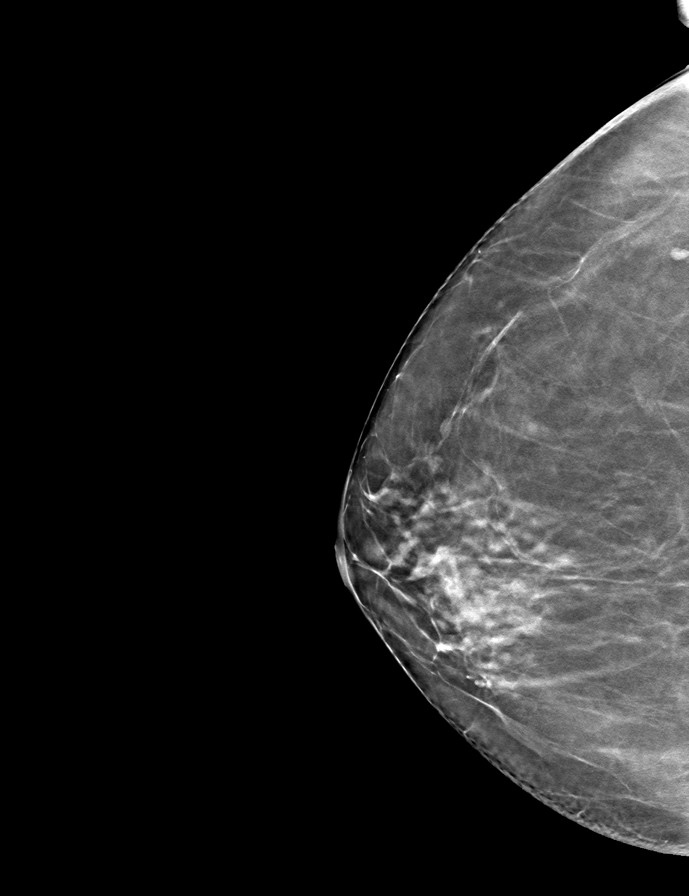
[frame 35/69]
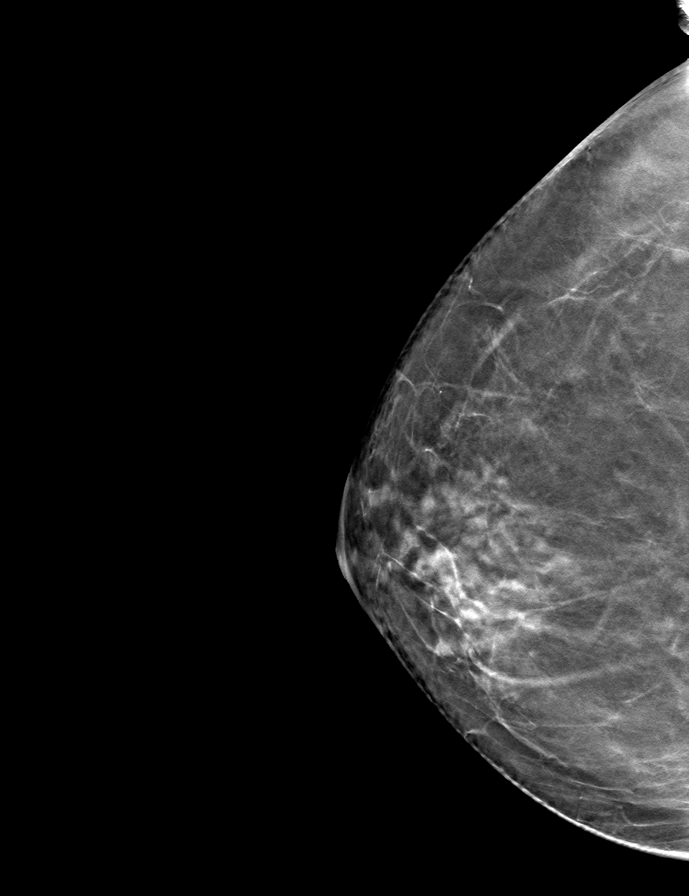

[L CC tomo · tomo slice 35/69.0]
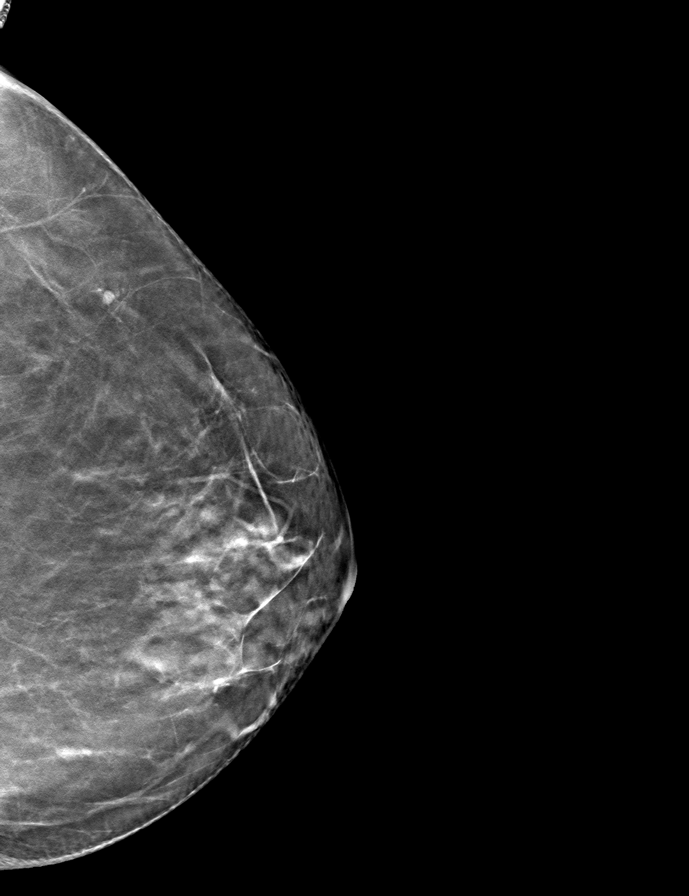

[L MLO tomo · tomo slice 38/75.0]
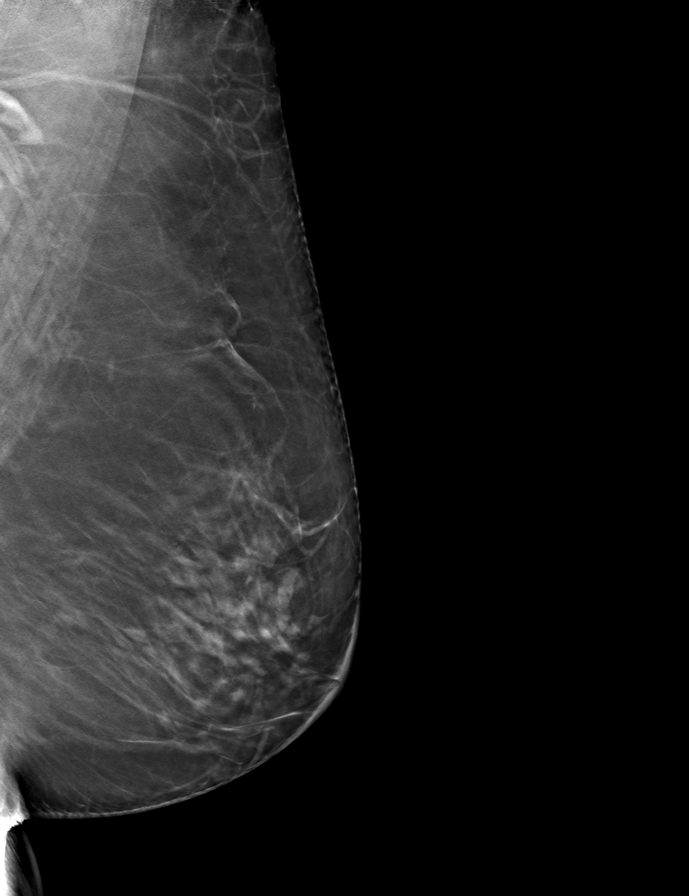

[R MLO tomo · tomo slice 37/74.0]
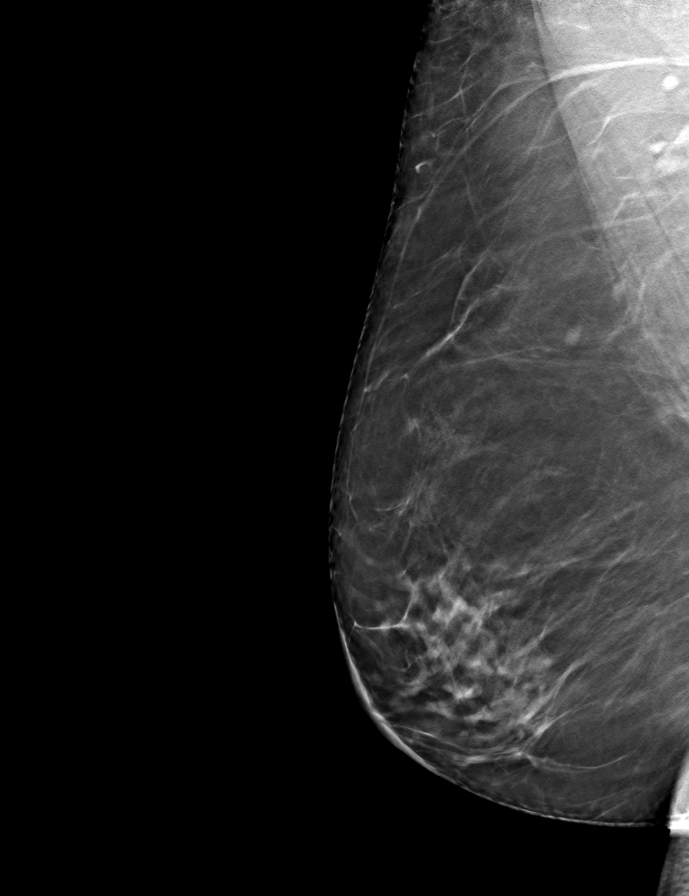

[9 of 24 positions shown; findings below may reference images not displayed]

ACR Breast Density Category b: There are scattered areas of
fibroglandular density.
FINDINGS: There are no findings suspicious for malignancy.
IMPRESSION: No mammographic evidence of malignancy. A result letter of this
screening mammogram will be mailed directly to the patient.

RECOMMENDATION:
Screening mammogram in one year. (Code:51-O-LD2)

BI-RADS CATEGORY  1: Negative.

## 2024-05-08 DIAGNOSIS — F419 Anxiety disorder, unspecified: Secondary | ICD-10-CM | POA: Diagnosis not present

## 2024-05-08 DIAGNOSIS — Z8249 Family history of ischemic heart disease and other diseases of the circulatory system: Secondary | ICD-10-CM | POA: Diagnosis not present

## 2024-05-08 DIAGNOSIS — M8580 Other specified disorders of bone density and structure, unspecified site: Secondary | ICD-10-CM | POA: Diagnosis not present

## 2024-05-08 DIAGNOSIS — R7303 Prediabetes: Secondary | ICD-10-CM | POA: Diagnosis not present

## 2024-05-08 DIAGNOSIS — M1990 Unspecified osteoarthritis, unspecified site: Secondary | ICD-10-CM | POA: Diagnosis not present

## 2024-05-08 DIAGNOSIS — I10 Essential (primary) hypertension: Secondary | ICD-10-CM | POA: Diagnosis not present

## 2024-05-08 DIAGNOSIS — E785 Hyperlipidemia, unspecified: Secondary | ICD-10-CM | POA: Diagnosis not present

## 2024-05-08 DIAGNOSIS — N959 Unspecified menopausal and perimenopausal disorder: Secondary | ICD-10-CM | POA: Diagnosis not present

## 2024-05-08 DIAGNOSIS — Z809 Family history of malignant neoplasm, unspecified: Secondary | ICD-10-CM | POA: Diagnosis not present

## 2024-06-20 ENCOUNTER — Other Ambulatory Visit: Payer: Self-pay | Admitting: *Deleted

## 2024-06-20 MED ORDER — ROSUVASTATIN CALCIUM 10 MG PO TABS: 10.0000 mg | ORAL_TABLET | Freq: Every day | ORAL | 0 refills | Status: DC

## 2024-07-25 ENCOUNTER — Ambulatory Visit: Payer: Self-pay | Admitting: Family

## 2024-08-08 ENCOUNTER — Other Ambulatory Visit: Payer: Self-pay | Admitting: Adult Health

## 2024-08-08 ENCOUNTER — Ambulatory Visit: Admitting: Family

## 2024-08-08 ENCOUNTER — Encounter: Payer: Self-pay | Admitting: Family

## 2024-08-08 VITALS — BP 115/75 | HR 83 | Temp 98.1°F | Ht 64.0 in | Wt 171.2 lb

## 2024-08-08 DIAGNOSIS — F411 Generalized anxiety disorder: Secondary | ICD-10-CM

## 2024-08-08 DIAGNOSIS — R7303 Prediabetes: Secondary | ICD-10-CM | POA: Diagnosis not present

## 2024-08-08 DIAGNOSIS — M79671 Pain in right foot: Secondary | ICD-10-CM

## 2024-08-08 DIAGNOSIS — M4722 Other spondylosis with radiculopathy, cervical region: Secondary | ICD-10-CM | POA: Diagnosis not present

## 2024-08-08 DIAGNOSIS — F339 Major depressive disorder, recurrent, unspecified: Secondary | ICD-10-CM

## 2024-08-08 DIAGNOSIS — F410 Panic disorder [episodic paroxysmal anxiety] without agoraphobia: Secondary | ICD-10-CM

## 2024-08-08 DIAGNOSIS — Z79818 Long term (current) use of other agents affecting estrogen receptors and estrogen levels: Secondary | ICD-10-CM

## 2024-08-08 DIAGNOSIS — I10 Essential (primary) hypertension: Secondary | ICD-10-CM | POA: Diagnosis not present

## 2024-08-08 DIAGNOSIS — E7800 Pure hypercholesterolemia, unspecified: Secondary | ICD-10-CM | POA: Diagnosis not present

## 2024-08-08 LAB — BAYER DCA HB A1C WAIVED: HB A1C (BAYER DCA - WAIVED): 5.9 % — ABNORMAL HIGH (ref 4.8–5.6)

## 2024-08-08 MED ORDER — HYDROXYZINE HCL 25 MG PO TABS: 25.0000 mg | ORAL_TABLET | Freq: Three times a day (TID) | ORAL | 2 refills | Status: AC | PRN

## 2024-08-08 MED ORDER — FLUOXETINE HCL 10 MG PO TABS: 10.0000 mg | ORAL_TABLET | Freq: Every day | ORAL | 3 refills | Status: AC

## 2024-08-08 MED ORDER — DICLOFENAC SODIUM 75 MG PO TBEC: 75.0000 mg | DELAYED_RELEASE_TABLET | Freq: Two times a day (BID) | ORAL | 2 refills | Status: AC

## 2024-08-08 MED ORDER — ROSUVASTATIN CALCIUM 10 MG PO TABS: 10.0000 mg | ORAL_TABLET | Freq: Every day | ORAL | 2 refills | Status: AC

## 2024-08-08 NOTE — Patient Instructions (Signed)
 Health Maintenance After Age 71 After age 27, you are at a higher risk for certain long-term diseases and infections as well as injuries from falls. Falls are a major cause of broken bones and head injuries in people who are older than age 73. Getting regular preventive care can help to keep you healthy and well. Preventive care includes getting regular testing and making lifestyle changes as recommended by your health care provider. Talk with your health care provider about: Which screenings and tests you should have. A screening is a test that checks for a disease when you have no symptoms. A diet and exercise plan that is right for you. What should I know about screenings and tests to prevent falls? Screening and testing are the best ways to find a health problem early. Early diagnosis and treatment give you the best chance of managing medical conditions that are common after age 90. Certain conditions and lifestyle choices may make you more likely to have a fall. Your health care provider may recommend: Regular vision checks. Poor vision and conditions such as cataracts can make you more likely to have a fall. If you wear glasses, make sure to get your prescription updated if your vision changes. Medicine review. Work with your health care provider to regularly review all of the medicines you are taking, including over-the-counter medicines. Ask your health care provider about any side effects that may make you more likely to have a fall. Tell your health care provider if any medicines that you take make you feel dizzy or sleepy. Strength and balance checks. Your health care provider may recommend certain tests to check your strength and balance while standing, walking, or changing positions. Foot health exam. Foot pain and numbness, as well as not wearing proper footwear, can make you more likely to have a fall. Screenings, including: Osteoporosis screening. Osteoporosis is a condition that causes  the bones to get weaker and break more easily. Blood pressure screening. Blood pressure changes and medicines to control blood pressure can make you feel dizzy. Depression screening. You may be more likely to have a fall if you have a fear of falling, feel depressed, or feel unable to do activities that you used to do. Alcohol  use screening. Using too much alcohol  can affect your balance and may make you more likely to have a fall. Follow these instructions at home: Lifestyle Do not drink alcohol  if: Your health care provider tells you not to drink. If you drink alcohol : Limit how much you have to: 0-1 drink a day for women. 0-2 drinks a day for men. Know how much alcohol  is in your drink. In the [place] one drink equals one 12 oz bottle of beer (355 mL), one 5 oz glass of wine (148 mL), or one 1 oz glass of hard liquor (44 mL). Do not use any products that contain nicotine or tobacco. These products include cigarettes, chewing tobacco, and vaping devices, such as e-cigarettes. If you need help quitting, ask your health care provider. Activity  Follow a regular exercise program to stay fit. This will help you maintain your balance. Ask your health care provider what types of exercise are appropriate for you. If you need a cane or walker, use it as recommended by your health care provider. Wear supportive shoes that have nonskid soles. Safety  Remove any tripping hazards, such as rugs, cords, and clutter. Install safety equipment such as grab bars in bathrooms and safety rails on stairs. Keep rooms and walkways  well-lit. General instructions Talk with your health care provider about your risks for falling. Tell your health care provider if: You fall. Be sure to tell your health care provider about all falls, even ones that seem minor. You feel dizzy, tiredness (fatigue), or off-balance. Take over-the-counter and prescription medicines only as told by your health care provider. These include  supplements. Eat a healthy diet and maintain a healthy weight. A healthy diet includes low-fat dairy products, low-fat (lean) meats, and fiber from whole grains, beans, and lots of fruits and vegetables. Stay current with your vaccines. Schedule regular health, dental, and eye exams. Summary Having a healthy lifestyle and getting preventive care can help to protect your health and wellness after age 15. Screening and testing are the best way to find a health problem early and help you avoid having a fall. Early diagnosis and treatment give you the best chance for managing medical conditions that are more common for people who are older than age 42. Falls are a major cause of broken bones and head injuries in people who are older than age 64. Take precautions to prevent a fall at home. Work with your health care provider to learn what changes you can make to improve your health and wellness and to prevent falls. This information is not intended to replace advice given to you by your health care provider. Make sure you discuss any questions you have with your health care provider. Document Revised: 11/16/2020 Document Reviewed: 11/16/2020 Elsevier Patient Education  2024 [employer].

## 2024-08-08 NOTE — Progress Notes (Signed)
 "  Subjective:    Patient ID: Julia Harmon, female    DOB: 1955-11-26, 69 y.o.   MRN: [id number]  Chief Complaint  Patient presents with   Medical Management of Chronic Issues   PT presents to the office today for chronic follow up.   She is followed by GYN as needed. She prescribed estrace  0.5 mg daily. She had a hysterectomy at the age of 46.    Has hx of prediabetes.  Hypertension This is a chronic problem. The current episode started more than 1 year ago. The problem has been resolved since onset. The problem is controlled. Associated symptoms include anxiety. Pertinent negatives include no malaise/fatigue, peripheral edema or shortness of breath. Agents associated with hypertension include estrogens. Past treatments include angiotensin blockers and diuretics. The current treatment provides moderate improvement.  Hyperlipidemia This is a chronic problem. The current episode started more than 1 year ago. The problem is controlled. Recent lipid tests were reviewed and are normal. Pertinent negatives include no shortness of breath. Current antihyperlipidemic treatment includes statins. The current treatment provides moderate improvement of lipids. Risk factors for coronary artery disease include dyslipidemia, hypertension and a sedentary lifestyle.  Arthritis Presents for follow-up visit. She complains of pain and stiffness. Affected locations include the left foot and right foot. Her pain is at a severity of 4/10.  Depression        This is a chronic problem.  The current episode started more than 1 year ago.   The problem occurs intermittently.  Associated symptoms include restlessness.  Associated symptoms include no helplessness, no hopelessness, not sad and no suicidal ideas.  Past treatments include SSRIs - Selective serotonin reuptake inhibitors.  Past medical history includes anxiety.   Anxiety Presents for follow-up visit. Symptoms include excessive worry, nervous/anxious behavior  and restlessness. Patient reports no shortness of breath or suicidal ideas. Symptoms occur rarely. The severity of symptoms is mild.        Review of Systems  Constitutional:  Negative for malaise/fatigue.  Respiratory:  Negative for shortness of breath.   Musculoskeletal:  Positive for stiffness.  Psychiatric/Behavioral:  Negative for suicidal ideas. The patient is nervous/anxious.   All other systems reviewed and are negative.   Social History   Socioeconomic History   Marital status: Married    Spouse name: Not on file   Number of children: Not on file   Years of education: Not on file   Highest education level: Some college, no degree  Occupational History   Occupation: retired  Tobacco Use   Smoking status: Former    Types: Cigarettes   Smokeless tobacco: Never  Vaping Use   Vaping status: Never Used  Substance and Sexual Activity   Alcohol use: Yes    Alcohol/week: 2.0 standard drinks of alcohol    Types: 2 Glasses of wine per week    Comment: occ   Drug use: No   Sexual activity: Yes    Birth control/protection: Surgical    Comment: hyst  Other Topics Concern   Not on file  Social History Narrative   Not on file   Social Drivers of Health   Tobacco Use: Medium Risk (08/08/2024)   Patient History    Smoking Tobacco Use: Former    Smokeless Tobacco Use: Never    Passive Exposure: Not on file  Financial Resource Strain: Low Risk (08/07/2024)   Overall Financial Resource Strain (CARDIA)    Difficulty of Paying Living Expenses: Not hard at all  Food Insecurity: No Food Insecurity (08/07/2024)   Epic    Worried About Programme Researcher, Broadcasting/film/video in the Last Year: Never true    Ran Out of Food in the Last Year: Never true  Transportation Needs: No Transportation Needs (08/07/2024)   Epic    Lack of Transportation (Medical): No    Lack of Transportation (Non-Medical): No  Physical Activity: Unknown (08/07/2024)   Exercise Vital Sign    Days of Exercise per Week: 3  days    Minutes of Exercise per Session: Not on file  Stress: No Stress Concern Present (08/07/2024)   [employer] of Occupational Health - Occupational Stress Questionnaire    Feeling of Stress: Only a little  Social Connections: Moderately Integrated (08/07/2024)   Social Connection and Isolation Panel    Frequency of Communication with Friends and Family: More than three times a week    Frequency of Social Gatherings with Friends and Family: Once a week    Attends Religious Services: More than 4 times per year    Active Member of Clubs or Organizations: No    Attends Engineer, Structural: Not on file    Marital Status: Married  Depression (PHQ2-9): Low Risk (08/08/2024)   Depression (PHQ2-9)    PHQ-2 Score: 0  Alcohol Screen: Low Risk (08/07/2024)   Alcohol Screen    Last Alcohol Screening Score (AUDIT): 2  Housing: Low Risk (08/07/2024)   Epic    Unable to Pay for Housing in the Last Year: No    Number of Times Moved in the Last Year: 0    Homeless in the Last Year: No  Utilities: Not At Risk (08/08/2024)   Epic    Threatened with loss of utilities: No  Health Literacy: Adequate Health Literacy (08/11/2023)   B1300 Health Literacy    Frequency of need for help with medical instructions: Never   Family History  Problem Relation Age of Onset   Heart attack Mother    Lung cancer Father    Cancer Father    Emphysema Brother    Bladder Cancer Maternal Aunt    Stroke Maternal Grandmother    Heart attack Paternal Grandmother         Objective:   Physical Exam Vitals reviewed.  Constitutional:      General: She is not in acute distress.    Appearance: She is well-developed.  HENT:     Head: Normocephalic and atraumatic.     Right Ear: Tympanic membrane normal.     Left Ear: Tympanic membrane normal.  Eyes:     Pupils: Pupils are equal, round, and reactive to light.  Neck:     Thyroid: No thyromegaly.  Cardiovascular:     Rate and Rhythm: Normal rate and  regular rhythm.     Heart sounds: Normal heart sounds. No murmur heard. Pulmonary:     Effort: Pulmonary effort is normal. No respiratory distress.     Breath sounds: Normal breath sounds. No wheezing.  Abdominal:     General: Bowel sounds are normal. There is no distension.     Palpations: Abdomen is soft.     Tenderness: There is no abdominal tenderness.  Musculoskeletal:        General: No tenderness. Normal range of motion.     Cervical back: Normal range of motion and neck supple.  Skin:    General: Skin is warm and dry.  Neurological:     Mental Status: She is alert and oriented to  person, place, and time.     Cranial Nerves: No cranial nerve deficit.     Deep Tendon Reflexes: Reflexes are normal and symmetric.  Psychiatric:        Behavior: Behavior normal.        Thought Content: Thought content normal.        Judgment: Judgment normal.       BP 115/75   Pulse 83   Temp 98.1 F (36.7 C)   Ht 5' 4 (1.626 m)   Wt 171 lb 3.2 oz (77.7 kg)   SpO2 95%   BMI 29.39 kg/m      Assessment & Plan:  Julia Harmon comes in today with chief complaint of Medical Management of Chronic Issues   Diagnosis and orders addressed:  1. Osteoarthritis of spine with radiculopathy, cervical region - diclofenac  (VOLTAREN ) 75 MG EC tablet; Take 1 tablet (75 mg total) by mouth 2 (two) times daily.  Dispense: 180 tablet; Refill: 2 - CMP14+EGFR - CBC with Differential/Platelet  2. Right foot pain - diclofenac  (VOLTAREN ) 75 MG EC tablet; Take 1 tablet (75 mg total) by mouth 2 (two) times daily.  Dispense: 180 tablet; Refill: 2 - CMP14+EGFR - CBC with Differential/Platelet  3. Depression, recurrent - hydrOXYzine  (ATARAX ) 25 MG tablet; Take 1 tablet (25 mg total) by mouth 3 (three) times daily as needed.  Dispense: 90 tablet; Refill: 2 - FLUoxetine  (PROZAC ) 10 MG tablet; Take 1 tablet (10 mg total) by mouth daily.  Dispense: 90 tablet; Refill: 3 - CMP14+EGFR - CBC with  Differential/Platelet  4. Generalized anxiety disorder (Primary) - hydrOXYzine  (ATARAX ) 25 MG tablet; Take 1 tablet (25 mg total) by mouth 3 (three) times daily as needed.  Dispense: 90 tablet; Refill: 2 - FLUoxetine  (PROZAC ) 10 MG tablet; Take 1 tablet (10 mg total) by mouth daily.  Dispense: 90 tablet; Refill: 3 - CMP14+EGFR - CBC with Differential/Platelet  5. Current use of estrogen therapy - CMP14+EGFR - CBC with Differential/Platelet  6. Prediabetes - CMP14+EGFR - CBC with Differential/Platelet - Bayer DCA Hb A1c Waived  7. Panic attacks - CMP14+EGFR - CBC with Differential/Platelet  8. Primary hypertension - CMP14+EGFR - CBC with Differential/Platelet  9. Pure hypercholesterolemia - rosuvastatin  (CRESTOR ) 10 MG tablet; Take 1 tablet (10 mg total) by mouth daily.  Dispense: 90 tablet; Refill: 2 - CMP14+EGFR - CBC with Differential/Platelet  Labs pending Continue current medications  Keep follow up with specialists  Health Maintenance reviewed Diet and exercise encouraged  Return in about 6 months (around 02/05/2025), or if symptoms worsen or fail to improve.    Bari Learn, FNP   "

## 2024-08-09 ENCOUNTER — Ambulatory Visit: Payer: Self-pay | Admitting: Family

## 2024-08-09 LAB — CMP14+EGFR
ALT: 14 [IU]/L (ref 0–32)
AST: 19 [IU]/L (ref 0–40)
Albumin: 4.2 g/dL (ref 3.9–4.9)
Alkaline Phosphatase: 99 [IU]/L (ref 49–135)
BUN/Creatinine Ratio: 36 — ABNORMAL HIGH (ref 12–28)
BUN: 20 mg/dL (ref 8–27)
Bilirubin Total: 0.3 mg/dL (ref 0.0–1.2)
CO2: 21 mmol/L (ref 20–29)
Calcium: 9.1 mg/dL (ref 8.7–10.3)
Chloride: 103 mmol/L (ref 96–106)
Creatinine, Ser: 0.55 mg/dL — ABNORMAL LOW (ref 0.57–1.00)
Globulin, Total: 2.7 g/dL (ref 1.5–4.5)
Glucose: 100 mg/dL — ABNORMAL HIGH (ref 70–99)
Potassium: 3.6 mmol/L (ref 3.5–5.2)
Sodium: 140 mmol/L (ref 134–144)
Total Protein: 6.9 g/dL (ref 6.0–8.5)
eGFR: 99 mL/min/{1.73_m2}

## 2024-08-09 LAB — CBC WITH DIFFERENTIAL/PLATELET
Basophils Absolute: 0 10*3/uL (ref 0.0–0.2)
Basos: 1 %
EOS (ABSOLUTE): 0.2 10*3/uL (ref 0.0–0.4)
Eos: 4 %
Hematocrit: 40.4 % (ref 34.0–46.6)
Hemoglobin: 13.4 g/dL (ref 11.1–15.9)
Immature Grans (Abs): 0 10*3/uL (ref 0.0–0.1)
Immature Granulocytes: 0 %
Lymphocytes Absolute: 1.8 10*3/uL (ref 0.7–3.1)
Lymphs: 26 %
MCH: 30.9 pg (ref 26.6–33.0)
MCHC: 33.2 g/dL (ref 31.5–35.7)
MCV: 93 fL (ref 79–97)
Monocytes Absolute: 0.6 10*3/uL (ref 0.1–0.9)
Monocytes: 8 %
Neutrophils Absolute: 4.2 10*3/uL (ref 1.4–7.0)
Neutrophils: 61 %
Platelets: 263 10*3/uL (ref 150–450)
RBC: 4.34 x10E6/uL (ref 3.77–5.28)
RDW: 12.7 % (ref 11.7–15.4)
WBC: 6.9 10*3/uL (ref 3.4–10.8)

## 2024-08-13 ENCOUNTER — Ambulatory Visit: Payer: Medicare PPO

## 2025-02-06 ENCOUNTER — Ambulatory Visit: Admitting: Family
